# Patient Record
Sex: Female | Born: 1975 | Hispanic: No | Marital: Single | Smoking: Never smoker
Health system: Southern US, Community
[De-identification: ages and names within clinical notes are randomized; demographics above are authoritative.]

## PROBLEM LIST (undated history)

## (undated) DIAGNOSIS — O24419 Gestational diabetes mellitus in pregnancy, unspecified control: Secondary | ICD-10-CM

## (undated) HISTORY — DX: Gestational diabetes mellitus in pregnancy, unspecified control: O24.419

---

## 2000-12-23 ENCOUNTER — Ambulatory Visit (HOSPITAL_COMMUNITY): Admission: RE | Admit: 2000-12-23 | Discharge: 2000-12-23 | Payer: Self-pay | Admitting: Obstetrics

## 2001-05-22 ENCOUNTER — Inpatient Hospital Stay (HOSPITAL_COMMUNITY): Admission: AD | Admit: 2001-05-22 | Discharge: 2001-05-26 | Payer: Self-pay | Admitting: Obstetrics & Gynecology

## 2001-05-22 ENCOUNTER — Encounter (INDEPENDENT_AMBULATORY_CARE_PROVIDER_SITE_OTHER): Payer: Self-pay

## 2001-05-27 ENCOUNTER — Inpatient Hospital Stay (HOSPITAL_COMMUNITY): Admission: AD | Admit: 2001-05-27 | Discharge: 2001-05-30 | Payer: Self-pay | Admitting: *Deleted

## 2002-09-25 ENCOUNTER — Inpatient Hospital Stay (HOSPITAL_COMMUNITY): Admission: AD | Admit: 2002-09-25 | Discharge: 2002-09-25 | Payer: Self-pay | Admitting: Family Medicine

## 2003-06-03 ENCOUNTER — Encounter: Admission: RE | Admit: 2003-06-03 | Discharge: 2003-06-03 | Payer: Self-pay | Admitting: Obstetrics & Gynecology

## 2003-07-30 ENCOUNTER — Inpatient Hospital Stay (HOSPITAL_COMMUNITY): Admission: AD | Admit: 2003-07-30 | Discharge: 2003-07-30 | Payer: Self-pay | Admitting: *Deleted

## 2003-07-31 ENCOUNTER — Inpatient Hospital Stay (HOSPITAL_COMMUNITY): Admission: AD | Admit: 2003-07-31 | Discharge: 2003-08-02 | Payer: Self-pay | Admitting: Obstetrics & Gynecology

## 2003-08-17 ENCOUNTER — Encounter: Admission: RE | Admit: 2003-08-17 | Discharge: 2003-08-17 | Payer: Self-pay | Admitting: Obstetrics and Gynecology

## 2008-06-08 ENCOUNTER — Ambulatory Visit: Payer: Self-pay | Admitting: Family Medicine

## 2008-06-08 ENCOUNTER — Encounter: Payer: Self-pay | Admitting: Family Medicine

## 2008-06-08 LAB — CONVERTED CEMR LAB
Antibody Screen: NEGATIVE
Basophils Absolute: 0 10*3/uL (ref 0.0–0.1)
Basophils Relative: 0 % (ref 0–1)
Eosinophils Absolute: 0 10*3/uL (ref 0.0–0.7)
Eosinophils Relative: 1 % (ref 0–5)
HCT: 33.3 % — ABNORMAL LOW (ref 36.0–46.0)
Hemoglobin: 10.9 g/dL — ABNORMAL LOW (ref 12.0–15.0)
Hepatitis B Surface Ag: NEGATIVE
Lymphocytes Relative: 28 % (ref 12–46)
Lymphs Abs: 1.4 10*3/uL (ref 0.7–4.0)
MCHC: 32.7 g/dL (ref 30.0–36.0)
MCV: 89.8 fL (ref 78.0–100.0)
Monocytes Absolute: 0.2 10*3/uL (ref 0.1–1.0)
Monocytes Relative: 4 % (ref 3–12)
Neutro Abs: 3.4 10*3/uL (ref 1.7–7.7)
Neutrophils Relative %: 67 % (ref 43–77)
Platelets: 230 10*3/uL (ref 150–400)
RBC: 3.71 M/uL — ABNORMAL LOW (ref 3.87–5.11)
RDW: 13.3 % (ref 11.5–15.5)
Rh Type: POSITIVE
Rubella: 67.1 intl units/mL — ABNORMAL HIGH
Sickle Cell Screen: NEGATIVE
WBC: 5.1 10*3/uL (ref 4.0–10.5)

## 2008-06-15 ENCOUNTER — Encounter: Payer: Self-pay | Admitting: Family Medicine

## 2008-06-15 ENCOUNTER — Ambulatory Visit: Payer: Self-pay | Admitting: Family Medicine

## 2008-06-15 DIAGNOSIS — Z8742 Personal history of other diseases of the female genital tract: Secondary | ICD-10-CM | POA: Insufficient documentation

## 2008-06-15 LAB — CONVERTED CEMR LAB
Chlamydia, DNA Probe: NEGATIVE
GC Probe Amp, Genital: NEGATIVE

## 2008-06-24 ENCOUNTER — Encounter: Payer: Self-pay | Admitting: Family Medicine

## 2008-06-24 ENCOUNTER — Ambulatory Visit (HOSPITAL_COMMUNITY): Admission: RE | Admit: 2008-06-24 | Discharge: 2008-06-24 | Payer: Self-pay | Admitting: Family Medicine

## 2008-07-01 ENCOUNTER — Encounter: Payer: Self-pay | Admitting: Family Medicine

## 2008-07-06 ENCOUNTER — Ambulatory Visit: Payer: Self-pay | Admitting: Family Medicine

## 2008-07-07 ENCOUNTER — Encounter: Payer: Self-pay | Admitting: Family Medicine

## 2008-07-08 ENCOUNTER — Encounter: Payer: Self-pay | Admitting: Family Medicine

## 2008-07-08 ENCOUNTER — Ambulatory Visit (HOSPITAL_COMMUNITY): Admission: RE | Admit: 2008-07-08 | Discharge: 2008-07-08 | Payer: Self-pay | Admitting: Family Medicine

## 2008-07-29 ENCOUNTER — Ambulatory Visit: Payer: Self-pay | Admitting: Family Medicine

## 2008-07-29 LAB — CONVERTED CEMR LAB

## 2008-08-31 ENCOUNTER — Ambulatory Visit: Payer: Self-pay | Admitting: Family Medicine

## 2008-08-31 LAB — CONVERTED CEMR LAB
Bilirubin Urine: NEGATIVE
Blood in Urine, dipstick: NEGATIVE
Glucose, Urine, Semiquant: NEGATIVE
Ketones, urine, test strip: NEGATIVE
Nitrite: NEGATIVE
Protein, U semiquant: NEGATIVE
Specific Gravity, Urine: <1.005
Urobilinogen, UA: 0.2
WBC Urine, dipstick: NEGATIVE
pH: 6.5

## 2008-09-16 ENCOUNTER — Encounter: Payer: Self-pay | Admitting: Family Medicine

## 2008-09-16 ENCOUNTER — Ambulatory Visit: Payer: Self-pay | Admitting: Family Medicine

## 2008-09-16 LAB — CONVERTED CEMR LAB
Glucose, Urine, Semiquant: NEGATIVE
Hemoglobin: 11.6 g/dL — ABNORMAL LOW (ref 12.0–15.0)
MCHC: 33.4 g/dL (ref 30.0–36.0)
Platelets: 179 10*3/uL (ref 150–400)
RDW: 13.3 % (ref 11.5–15.5)
Varicella IgG: 2.47 — ABNORMAL HIGH

## 2008-09-20 ENCOUNTER — Ambulatory Visit: Payer: Self-pay | Admitting: Family Medicine

## 2008-09-20 ENCOUNTER — Encounter: Payer: Self-pay | Admitting: Family Medicine

## 2008-10-04 ENCOUNTER — Encounter: Payer: Self-pay | Admitting: Family Medicine

## 2008-10-04 ENCOUNTER — Ambulatory Visit: Payer: Self-pay | Admitting: Family Medicine

## 2008-10-04 LAB — CONVERTED CEMR LAB
Bilirubin Urine: NEGATIVE
Blood in Urine, dipstick: NEGATIVE
Glucose, Urine, Semiquant: NEGATIVE
Ketones, urine, test strip: NEGATIVE
Nitrite: NEGATIVE
Protein, U semiquant: NEGATIVE
Specific Gravity, Urine: 1.02
Urobilinogen, UA: 0.2
WBC Urine, dipstick: NEGATIVE
pH: 6.5

## 2008-10-21 ENCOUNTER — Ambulatory Visit: Payer: Self-pay | Admitting: Family Medicine

## 2008-11-05 ENCOUNTER — Ambulatory Visit: Payer: Self-pay | Admitting: Family Medicine

## 2008-11-05 ENCOUNTER — Encounter: Payer: Self-pay | Admitting: Family Medicine

## 2008-11-15 ENCOUNTER — Ambulatory Visit: Payer: Self-pay | Admitting: Family Medicine

## 2008-11-15 DIAGNOSIS — K029 Dental caries, unspecified: Secondary | ICD-10-CM | POA: Insufficient documentation

## 2008-11-26 ENCOUNTER — Ambulatory Visit: Payer: Self-pay | Admitting: Family Medicine

## 2008-12-04 ENCOUNTER — Ambulatory Visit: Payer: Self-pay | Admitting: Physician Assistant

## 2008-12-04 ENCOUNTER — Inpatient Hospital Stay (HOSPITAL_COMMUNITY): Admission: AD | Admit: 2008-12-04 | Discharge: 2008-12-06 | Payer: Self-pay | Admitting: Obstetrics & Gynecology

## 2008-12-04 ENCOUNTER — Ambulatory Visit: Payer: Self-pay | Admitting: Family Medicine

## 2009-01-20 ENCOUNTER — Ambulatory Visit: Payer: Self-pay | Admitting: Family Medicine

## 2009-01-20 LAB — CONVERTED CEMR LAB: Beta hcg, urine, semiquantitative: NEGATIVE

## 2010-08-13 ENCOUNTER — Encounter: Payer: Self-pay | Admitting: Family Medicine

## 2010-08-22 NOTE — Assessment & Plan Note (Signed)
Summary: postpartum,tcb   Vital Signs:  Patient profile:   35 year old female Height:      60.5 inches Weight:      140 pounds BMI:     26.99 Pulse rate:   65 / minute BP sitting:   126 / 81  (left arm)  Vitals Entered By: Arlyss Repress CMA, (January 20, 2009 3:26 PM) CC: postpartum check.  Is Patient Diabetic? No Pain Assessment Patient in pain? no        Primary Care Provider:  Helane Rima DO  CC:  postpartum check. .  History of Present Illness: Jackie Abbott is a 35 year old female presenting for a postpartum check. She delivered a viable female via NSVD last May.   She denies any physical complaints today but endorses some s/s of post-partum depression. These include: crying while breastfeeding, feeling alone even when she is with others, feeling overwhelmed, and sadness. She denies SI or any thoughts of hurting her children. Her husband is supportive and she has friends in the area but she does not feel comfortable talking to her friends about these feelings. She endorses a history of PPD with her other children, each time lasting about 3-4 months. She is not interested in medication and does not feel that these feelings are bad enough to warrant further evaluation or intervention at this time.  Contraception: She is interested in Implanon but has not gotten her [facility] [employer] card yet, so she would like to take OCPs until getting then.  Habits & Providers  Alcohol-Tobacco-Diet     Tobacco Status: never PMH-FH-SH reviewed for relevance  Review of Systems       see HPI, otherwise negative  Physical Exam  General:  alert, well-developed, well-nourished, and well-hydrated.   Head:  normocephalic and atraumatic.   Eyes:  vision grossly intact, pupils equal, pupils round, and pupils reactive to light.   Mouth:  pharynx pink and moist.   Neck:  supple.   Lungs:  CTAB Heart:  RRR no m/r/g Abdomen:  soft, non-tender, and normal bowel sounds.   Genitalia:  Pelvic Exam:     External: normal female genitalia without lesions or masses        Vagina: normal without lesions or masses        Cervix: normal without lesions or masses        Adnexa: normal bimanual exam without masses or fullness        Uterus: normal by palpation        Pap smear: not performed Psych:  Oriented X3, memory intact for recent and remote, normally interactive, good eye contact, not anxious appearing, not suicidal, and not homicidal.     Impression & Recommendations:  Problem # 1:  PREGNANCY, NORMAL (ICD-V22.2) Assessment New  Normal Post-Partum Check with endorsement of mild post-partum depression. Educated patient about this condition, reassured her that this is normal, and encouraged her to come back if it worsens. She endorsed understanding.   [facility]  Problem # 2:  CONTRACEPTIVE MANAGEMENT (ICD-V25.09) Assessment: New Rx: Tri Sprintec until the patient gets a Therapist, music orange card and then Implanon insertion.  Orders: U Preg-[facility] (60454) Postpartum visit- [facility] (09811)  Complete Medication List: 1)  Gnp Prenatal Vitamins Tabs (Prenatal vit-fe fumarate-fa) .... One by mouth daily 2)  Tri-sprintec 0.18/0.215/0.25 Mg-35 Mcg Tabs (Norgestim-eth estrad triphasic)  Patient Instructions: 1)  It was great to see you again! 2)  Let me know if you  have any concerns or questions. Prescriptions: TRI-SPRINTEC 0.18/0.215/0.25 MG-35 MCG TABS (NORGESTIM-ETH ESTRAD TRIPHASIC)   #0 x 0   Entered and Authorized by:   Helane Rima MD   Signed by:   Helane Rima MD on 01/20/2009   Method used:   Print then Give to Patient   RxID:   [id number]   Laboratory Results   Urine Tests  Date/Time Received: January 20, 2009 4:16 PM  Date/Time Reported: January 20, 2009 4:22 PM     Urine HCG: negative Comments: ...........test performed by...........Marland KitchenTerese Door, CMA      Appended Document: Changed to monophasic OCP    Clinical Lists  Changes  Medications: Changed medication from TRI-SPRINTEC 0.18/0.215/0.25 MG-35 MCG TABS (NORGESTIM-ETH ESTRAD TRIPHASIC) to SPRINTEC 28 0.25-35 MG-MCG TABS (NORGESTIMATE-ETH ESTRADIOL) one by mouth daily - Signed Rx of SPRINTEC 28 0.25-35 MG-MCG TABS (NORGESTIMATE-ETH ESTRADIOL) one by mouth daily;  #1 x 3;  Signed;  Entered by: Helane Rima MD;  Authorized by: Helane Rima MD;  Method used: Historical    Prescriptions: SPRINTEC 28 0.25-35 MG-MCG TABS (NORGESTIMATE-ETH ESTRADIOL) one by mouth daily  #1 x 3   Entered and Authorized by:   Helane Rima MD   Signed by:   Helane Rima MD on 01/25/2009   Method used:   Historical   RxID:   [id number]

## 2010-08-22 NOTE — Miscellaneous (Signed)
Summary: Orders Update  Clinical Lists Changes  Orders: Added new Test order of Prenatal U/S > 14 weeks - 24401 (Prenatal U/S) - Signed

## 2010-08-22 NOTE — Assessment & Plan Note (Signed)
Summary: ob/kh   Vital Signs:  Patient Profile:   35 Years Old Female Height:     142 inches Weight:      152 pounds Pulse rate:   69 / minute Pulse rhythm:   regular BP sitting:   105 / 66  (right arm)  Pt. in pain?   no  Vitals Entered By: Modesta Messing LPN (August 31, 2008 3:50 PM)                  PCP:  Helane Rima  Chief Complaint:  OB follow up visit.Marland Kitchen  History of Present Illness: Jackie Abbott is a 35 year old G3P2002 at 66.[redacted] weeks gestational age. EDC is 12/09/07 calculated by LMP (03/03/08) and confirmed by ultrasound (at 16.1 weeks with [facility] 12/03/07) on 06/24/08. She is participating in the [employer] program and presents with a Spanish-speaking interpretor.      Risk Factors:       Impression & Recommendations:  Problem # 1:  PREGNANCY, NORMAL (ICD-V22.2) Assessment: Unchanged Doing well. Follow up in 2 weeks OB Clinic. Discussed preterm labor precautions. Labs due at next visit include CBC, RPR, HIV, 1 hour glucola.  Type/Rh: O Positive Antibody Screen: Negative Hgb 10.9 (06/08/08) Platelets 230 (06/08/08) Rubella: Immune HBsAg: Negative Syphilis: NR HIV: NR GC: Negative (06/15/08) Chlamydia: Negative (06/15/08) Sickle Cell: Negative 1 Hour Glucola: 06/15/08 at 14.[redacted] Weeks GA = 129 MSAFP WNL + Received Seasonal and H1N1 vaccines Most recent ultrasound at 18.1 weeks: Normal Female  Name: Jackie Abbott Breast feeding Contraception: Condoms, thinking about BTL (does not want more babies)  Orders: Urinalysis-[facility] (00000) Other OB visit- [facility] (OBCK)   Problem # 2:  SUPRAPUBIC PAIN (ICD-789.09) UA negative. Pain likely due to enlarging uterus.  Orders: Urinalysis-[facility] (00000)   Problem # 3:  DIABETES MELLITUS, GESTATIONAL, HX OF (ICD-V13.29) Assessment: Unchanged 1 Hour Glucola: 06/15/08 at 14.[redacted] Weeks GA = 129 Will repeat at 28 week visit.   Patient Instructions: 1)  Please schedule a follow-up appointment in the OB clinic in 3  weeks. 2)  Please go to the [facility] if you have bleeding or a gush of fluid from your vagina or if you have contractions that are regular.     Flowsheet View for Follow-up Visit    Estimated weeks of       gestation:     25 6/7    Weight:     152    Blood pressure:   105 / 66    Urine Protein:     negative    Urine Glucose:   negative    Urine Nitrite:     negative    Headache:     No    Nausea/vomiting:   No    Edema:     0    Vaginal bleeding:   no    Vaginal discharge:   no    Fundal height:      26    FHR:       150s    Fetal activity:     yes    Labor symptoms:   no    Taking prenatal vits?   Y    Smoking:     n/a    Next visit:     2 wk    Resident:     EW    Preceptor:     Hensel    Comment:     Doing well. Mild round ligament pain. Some suprapubic pain,  intermittent, worse with walking, no dysuria.   Flowsheet View for Follow-up Visit    Estimated weeks of       gestation:     25 6/7    Weight:     152    Blood pressure:   105 / 66    Urine protein:       negative    Urine glucose:    negative    Urine nitrite:     negative    Hx headache?     No    Nausea/vomiting?   No    Edema?     0    Bleeding?     no    Leakage/discharge?   no    Fetal activity:       yes    Labor symptoms?   no    Fundal height:      26    FHR:       150s    Taking Vitamins?   Y    Smoking PPD:   n/a    Comment:     Doing well. Mild round ligament pain. Some suprapubic pain, intermittent, worse with walking, no dysuria.    Next visit:     2 wk    Resident:     EW    Preceptor:     Hensel   Laboratory Results   Urine Tests  Date/Time Received: August 31, 2008 4:09 PM  Date/Time Reported: August 31, 2008 4:11 PM   Routine Urinalysis   Color: yellow Appearance: Clear Glucose: negative   (Normal Range: Negative) Bilirubin: negative   (Normal Range: Negative) Ketone: negative   (Normal Range: Negative) Spec. Gravity: <1.005   (Normal Range:  1.003-1.035) Blood: negative   (Normal Range: Negative) pH: 6.5   (Normal Range: 5.0-8.0) Protein: negative   (Normal Range: Negative) Urobilinogen: 0.2   (Normal Range: 0-1) Nitrite: negative   (Normal Range: Negative) Leukocyte Esterace: negative   (Normal Range: Negative)    Comments: ...........test performed by...........Marland KitchenTerese Door, CMA

## 2010-08-22 NOTE — Assessment & Plan Note (Signed)
Summary: ob check   Vital Signs:  Patient Profile:   35 Years Old Female Height:     142 inches Weight:      151.1 pounds BP sitting:   117 / 74  Vitals Entered By: Dedra Skeens CMA, (July 29, 2008 4:15 PM)  Menstrual History: EDC by LMP: 12/08/2008 Best Working EDC: 12/08/2008                 PCP:  Helane Rima   History of Present Illness: Jackie Abbott is a 35 year old G3P2002 at 21.[redacted] weeks gestational age. EDC is 12/09/07 calculated by LMP (03/03/08) and confirmed by ultrasound (at 16.1 weeks with [facility] 12/03/07) on 06/24/08. She is participating in the [employer] program and presents with a Spanish-speaking interpretor.      Risk Factors:       Impression & Recommendations:  Problem # 1:  PREGNANCY, NORMAL (ICD-V22.2) No concerns today. Discussed small meals throughout the day, Tums, and elevating head-of-bed for improvement of reflux.  Type/Rh: O Positive Antibody Screen: Negative Hgb 10.9 (06/08/08) Platelets 230 (06/08/08) Rubella: Immune HBsAg: Negative Syphilis: NR HIV: NR GC: Negative (06/15/08) Chlamydia: Negative (06/15/08) Sickle Cell: Negative 1 Hour Glucola: 06/15/08 at 14.[redacted] Weeks GA = 129 MSAFP WNL + Received Seasonal and H1N1 vaccines Most recent ultrasound at 18.1 weeks: Normal Female  Name: Jackie Abbott   Problem # 2:  DIABETES MELLITUS, GESTATIONAL, HX OF (ICD-V13.29) 1 Hour Glucola: 06/15/08 at 14.[redacted] Weeks GA = 129 Will repeat at 28 week visit.  Other Orders: Other OB visit- [facility])   Patient Instructions: 1)  Please schedule a follow-up appointment in 1 month.   ]  OB Initial Intake Information    Race: Hispanic    Marital status: Single  Menstrual History    LMP (date): 03/03/2008    EDC by LMP: 12/08/2008    Best Working EDC: 12/08/2008    LMP - Character: normal    Menarche: 12 years    Menses interval: 28 days    On BCP's at conception: no    Date of positive (+) home preg. test: 04/11/2008   Flowsheet  View for Follow-up Visit    Estimated weeks of       gestation:     22 0/7    Weight:     151.1    Blood pressure:   117 / 74    Hx headache?     No    Nausea/vomiting?   No    Edema?     0    Bleeding?     no    Leakage/discharge?   no    Fetal activity:       yes    Labor symptoms?   no    Fundal height:      21    FHR:       150s    Taking Vitamins?   Y    Smoking PPD:   n/a    Comment:     No concerns today. Mild heartburn.    Next visit:     4 wk    Resident:     EW    Preceptor:     Sheffield Slider   OB Initial Intake Information    Race: Hispanic    Marital status: Single  Menstrual History    LMP (date): 03/03/2008    EDC by LMP: 12/08/2008    Best Working EDC: 12/08/2008    LMP - Character: normal  Menarche: 12 years    Menses interval: 28 days    On BCP's at conception: no    Date of positive (+) home preg. test: 04/11/2008   Prenatal Visit EDC Confirmation:    New working [facility]: 12/08/2008    EDC by LMP: 12/08/2008    Genetic History     > 3 spont. abortions:   mother: no    Hx of stillbirth:     mother: no  Infection Risk History    High Risk Hepatitis B: no    Immunized against Hepatitis B: no    Exposure to TB: no    Patient with history of Genital Herpes: no    Sexual partner with history of Genital Herpes: no    History of STD (GC, Chlamydia, Syphilis, HPV): no    Rash, Viral, or Febrile Illness since LMP: no    Exposure to Cat Litter: no  Environmental Exposures    Xray Exposure since LMP: no    Chemical or other exposure: no    Medication, drug, or alcohol use since LMP: no    Prenatal Education Provided: 1)  Elevate head of bed at least 6 inches (raise bed posts not just with pillows); small frequent meals and take TUMS as needed. 2)  Advice on healthy diet for pregnancy reviewed and information provided. 3)  Stressed importance of taking folic acid and Prenatal vitamins. 4)  Discussed signs and symptoms of premature labor and instructed  to report any such symtpoms immediately.

## 2010-08-22 NOTE — Assessment & Plan Note (Signed)
Summary: 1:45 ob/eo   Vital Signs:  Patient profile:   35 year old female Height:      60.5 inches Weight:      160.3 pounds BP sitting:   120 / 79  (right arm)  Vitals Entered By: Modesta Messing LPN (Nov 27, 863 1:43 PM) CC: OB follow up .  38 2/7 weeks   History of Present Illness: Jackie Abbott is a 35 year old G3P2002 at 30.[redacted] weeks gestational age. EDC is 12/08/08 calculated by LMP (03/03/08) and confirmed by ultrasound (at 16.1 weeks with [facility] 12/03/07) on 06/24/08. She is participating in the [employer] program and presents with a Spanish-speaking interpretor.   Habits & Providers     Cigarette Packs/Day: n/a   Impression & Recommendations:  Problem # 1:  PREGNANCY, NORMAL (ICD-V22.2) Assessment Unchanged  Doing well. Discussed labor precautions. Provided contact info. Dr. Jeanice Lim is back-up. Will give infor for [facility].  Type/Rh: O Positive Antibody Screen: Negative Hgb 10.9 (06/08/08), 11.6 (09/16/08) Platelets 230 (06/08/08), 179 (09/16/08) Rubella: Immune HBsAg: Negative Syphilis: NR x 2 HIV: NR x 2 GC: Negative (06/15/08) Chlamydia: Negative (06/15/08) Sickle Cell: Negative Varicella negative 1 Hour Glucola: 06/15/08 at 14.[redacted] Weeks GA = 129 1 Hour Glucola: 09/16/08 at 28.1 = 136 3 Hour (09/20/08): 73, 146, 164, 135 MSAFP WNL + Received Seasonal and H1N1 vaccines Most recent ultrasound at 18.1 weeks: Normal Female  Name: Jackie Abbott Contraception: Condoms GBS negative (11/05/08)  Orders: Other OB visit- [facility] (OBCK)  Problem # 2:  DIABETES MELLITUS, GESTATIONAL, HX OF (ICD-V13.29) Assessment: Unchanged 3 Hour (09/20/08): 73, 146, 164, 135. One elevated lab value (2 hour = 164). Discussed borderline diabetes with patient. Will monitor fundal height. Discussed diabetic diet with patient.  Problem # 3:  DENTAL CARIES (ICD-521.00) Discussed dangers of poor dental health during pregnancy and RED flags for seeking medical care. Patient has  dentist.  Complete Medication List: 1)  Gnp Prenatal Vitamins Tabs (Prenatal vit-fe fumarate-fa) .... One by mouth daily  Patient Instructions: 1)  Please schedule a follow-up appointment in 1 week.  2)  If you have contractions every 5 minutes for more than one hour or that do not go away with sitting down and drinking water, or if you have bleeding or a gush of fluid from your vagina, please go to the [facility]. I will give you my card to give to them when you get there. 3)  Please don't hesitate to call with questions.     OB Initial Intake Information    Race: Hispanic    Marital status: Single  Menstrual History    LMP (date): 03/03/2008    LMP - Character: normal    Menarche: 12 years    Menses interval: 28 days    On BCP's at conception: no    Date of positive (+) home preg. test: 04/11/2008   Flowsheet View for Follow-up Visit    Estimated weeks of       gestation:     38 2/7    Weight:     160.3    Blood pressure:   120 / 79    Headache:     No    Nausea/vomiting:   No    Edema:     0    Vaginal bleeding:   no    Vaginal discharge:   no    Fundal height:      39    FHR:  130s    Fetal activity:     yes    Labor symptoms:   few ctx    Cx Dilation:     2    Cx Effacement:   30%    Cx Station:     high    Taking prenatal vits?   Y    Smoking:     n/a    Next visit:     1 wk    Resident:     EW    Preceptor:     Chambliss    Comment:     Doing well. No concerns.   Flowsheet View for Follow-up Visit    Estimated weeks of       gestation:     38 2/7    Weight:     160.3    Blood pressure:   120 / 79    Headache:     No    Nausea/vomiting:   No    Edema:     0    Vaginal bleeding:   no    Vaginal discharge:   no    Fundal height:      39    FHR:       130s    Fetal activity:     yes    Labor symptoms:   few ctx    Cx Dilation:     2    Cx Effacement:   30%    Cx Station:     high    Taking prenatal vits?   Y    Smoking:     n/a     Next visit:     1 wk    Resident:     EW    Preceptor:     Chambliss    Comment:     Doing well. No concerns.    Flowsheet View for Follow-up Visit    Estimated weeks of       gestation:     38 2/7    Weight:     160.3    Blood pressure:   120 / 79    Hx headache?     No    Nausea/vomiting?   No    Edema?     0    Bleeding?     no    Leakage/discharge?   no    Fetal activity:       yes    Labor symptoms?   few ctx    Fundal height:      39    FHR:       130s    Cx dilation:     2    Cx effacement:   30%    Fetal station:     high    Taking Vitamins?   Y    Smoking PPD:   n/a    Comment:     Doing well. No concerns.    Next visit:     1 wk    Resident:     EW    Preceptor:     Deirdre Priest

## 2010-08-22 NOTE — Assessment & Plan Note (Signed)
Summary: OB/KH   Vital Signs:  Patient profile:   35 year old female Weight:      152.38 pounds Temp:     98.1 degrees F oral BP sitting:   104 / 64  (right arm)  Vitals Entered By: Dorna Leitz CC: OB VISIT   History of Present Illness: Jackie Abbott is a 35 year old G3P2002 at 44.[redacted] weeks gestational age. EDC is 12/08/08 calculated by LMP (03/03/08) and confirmed by ultrasound (at 16.1 weeks with [facility] 12/03/07) on 06/24/08. She is participating in the [employer]Mom program and presents with a Spanish-speaking interpretor.   Habits & Providers     Cigarette Packs/Day: n/a   Impression & Recommendations:  Problem # 1:  PREGNANCY, NORMAL (ICD-V22.2)  Doing well. Follow up in 2 weeks. GBS at next visit. Discussed preterm labor precautions. Provided note to work Re: sitting when patient feels the need to do so. Will monitor weight gain as patient down this week.  Provided contact info. Dr. Jeanice Lim is back-up.  Type/Rh: O Positive Antibody Screen: Negative Hgb 10.9 (06/08/08), 11.6 (09/16/08) Platelets 230 (06/08/08), 179 (09/16/08) Rubella: Immune HBsAg: Negative Syphilis: NR x 2 HIV: NR x 2 GC: Negative (06/15/08) Chlamydia: Negative (06/15/08) Sickle Cell: Negative Varicella negative 1 Hour Glucola: 06/15/08 at 14.[redacted] Weeks GA = 129 1 Hour Glucola 09/16/08 = 136 3 Hour (09/20/08): 73, 146, 164, 135 MSAFP WNL + Received Seasonal and H1N1 vaccines Most recent ultrasound at 18.1 weeks: Normal Female  Name: Jackie Abbott Breast feeding Contraception: Condoms, thinking about BTL (does not want more babies)  Orders: Other OB visit- [facility] (OBCK)  Problem # 2:  DIABETES MELLITUS, GESTATIONAL, HX OF (ICD-V13.29) Assessment: Unchanged 3 Hour (09/20/08): 73, 146, 164, 135. One elevated lab value (2 hour = 164). Discussed borderline diabetes with patient. Will not send to [facility] or begin medication at this time. Will monitor fundal height. Discussed diabetic diet with  patient.  Complete Medication List: 1)  Hydrocortisone 1 % Crea (Hydrocortisone) .... Apply to affected area on arms daily. do not apply to face dispense- 1 large tube  Patient Instructions: 1)  Please schedule a follow-up appointment in 2 weeks.  2)  If you have contractions every 5 minutes for more than one hour or that do not go away with sitting down and drinking water, or if you have bleeding or a gush of fluid from your vagina, please go to the [facility]. I will give you my card to give to them when you get there. 3)  Please don't hesitate to call with questions.   Flowsheet View for Follow-up Visit    Estimated weeks of       gestation:     33 1/7    Weight:     152.38    Blood pressure:   104 / 64    Hx headache?     No    Nausea/vomiting?   No    Edema?     0    Bleeding?     no    Leakage/discharge?   no    Fetal activity:       yes    Labor symptoms?   few ctx    Fundal height:      33    FHR:       150S    Taking Vitamins?   Y    Smoking PPD:   n/a    Comment:     Doing well. Few BH contractions.  Walking too much at work.    Next visit:     2 wk    Resident:     EW    Preceptor:     Mauricio Po

## 2010-08-22 NOTE — Assessment & Plan Note (Signed)
Summary: OB/KH   Vital Signs:  Patient profile:   35 year old female Weight:      158.7 pounds Temp:     98.4 degrees F oral Pulse rate:   65 / minute BP sitting:   123 / 84  (left arm)  Vitals Entered By: Alphia Kava (November 15, 2008 1:46 PM)  CC: OB Is Patient Diabetic? No   History of Present Illness: Jackie Abbott is a 35 year old G3P2002 at 28.[redacted] weeks gestational age. EDC is 12/08/08 calculated by LMP (03/03/08) and confirmed by ultrasound (at 16.1 weeks with [facility] 12/03/07) on 06/24/08. She is participating in the [employer] program and presents with a Spanish-speaking interpretor.   Habits & Providers     Cigarette Packs/Day: n/a   Impression & Recommendations:  Problem # 1:  PREGNANCY, NORMAL (ICD-V22.2) Assessment Unchanged  Doing well. Discussed labor precautions. Patient did lose weight this week, says that she has been working hard (includes lots of walking), but baby measuring well. Provided contact info. Dr. Jeanice Lim is back-up.  Type/Rh: O Positive Antibody Screen: Negative Hgb 10.9 (06/08/08), 11.6 (09/16/08) Platelets 230 (06/08/08), 179 (09/16/08) Rubella: Immune HBsAg: Negative Syphilis: NR x 2 HIV: NR x 2 GC: Negative (06/15/08) Chlamydia: Negative (06/15/08) Sickle Cell: Negative Varicella negative 1 Hour Glucola: 06/15/08 at 14.[redacted] Weeks GA = 129 1 Hour Glucola 09/16/08 = 136 3 Hour (09/20/08): 73, 146, 164, 135 MSAFP WNL + Received Seasonal and H1N1 vaccines Most recent ultrasound at 18.1 weeks: Normal Female  Name: Jackie Abbott feeding Contraception: Condoms, thinking about BTL (does not want more babies) GBS negative (11/05/08)  Orders: Other OB visit- [facility] (OBCK)  Problem # 2:  DIABETES MELLITUS, GESTATIONAL, HX OF (ICD-V13.29) Assessment: Unchanged  3 Hour (09/20/08): 73, 146, 164, 135. One elevated lab value (2 hour = 164). Discussed borderline diabetes with patient. Will not send to [facility] or begin medication at this time. Will monitor  fundal height. Discussed diabetic diet with patient.  Orders: Other OB visit- [facility] (OBCK)  Problem # 3:  DENTAL CARIES (ICD-521.00) Assessment: New  Discussed dangers of poor dental health during pregnancy and RED flags for seeking medical care. Patient has dentist, needs okay for local anesthesia for dental procedure. Precepted with Dr. Mauricio Po, gave okay.  Orders: Other OB visit- [facility] (OBCK)  Complete Medication List: 1)  Gnp Prenatal Vitamins Tabs (Prenatal vit-fe fumarate-fa) .... One by mouth daily  Patient Instructions: 1)  Please schedule a follow-up appointment in 1 week.  2)  If you have contractions every 5 minutes for more than one hour or that do not go away with sitting down and drinking water, or if you have bleeding or a gush of fluid from your vagina, please go to the [facility]. I will give you my card to give to them when you get there. 3)  Please don't hesitate to call with questions. Prescriptions: GNP PRENATAL VITAMINS  TABS (PRENATAL VIT-FE FUMARATE-FA) one by mouth daily  #30 x 3   Entered and Authorized by:   Helane Rima MD   Signed by:   Helane Rima MD on 11/15/2008   Method used:   Historical   RxID:   [id number]        OB Initial Intake Information    Race: Hispanic    Marital status: Single  Menstrual History    LMP (date): 03/03/2008    LMP - Character: normal    Menarche: 12 years    Menses interval: 28 days  On BCP's at conception: no    Date of positive (+) home preg. test: 04/11/2008   Flowsheet View for Follow-up Visit    Estimated weeks of       gestation:     36 5/7    Weight:     158.7    Blood pressure:   123 / 84    Headache:     No    Nausea/vomiting:   No    Edema:     0    Vaginal bleeding:   no    Vaginal discharge:   no    Fundal height:      38    FHR:       150s    Fetal activity:     yes    Labor symptoms:   few ctx    Fetal position:     vertex    Cx Dilation:     2    Cx Effacement:   30%     Cx Station:     high    Taking prenatal vits?   Y    Smoking:     n/a    Next visit:     1 wk    Resident:     EW    Preceptor:     Mauricio Po    Comment:     Doing well. Ready to stop working.    Flowsheet View for Follow-up Visit    Estimated weeks of       gestation:     36 5/7    Weight:     158.7    Blood pressure:   123 / 84    Hx headache?     No    Nausea/vomiting?   No    Edema?     0    Bleeding?     no    Leakage/discharge?   no    Fetal activity:       yes    Labor symptoms?   few ctx    Fundal height:      38    FHR:       150s    Fetal position:      vertex    Cx dilation:     2    Cx effacement:   30%    Fetal station:     high    Taking Vitamins?   Y    Smoking PPD:   n/a    Comment:     Doing well. Ready to stop working.     Next visit:     1 wk    Resident:     EW    Preceptor:     Mauricio Po   Appended Document: OB/KH FAXED RECORDS TO [facility]

## 2010-08-22 NOTE — Assessment & Plan Note (Signed)
Summary: OB F/U [facility]   Vital Signs:  Patient profile:   35 year old female Height:      142 inches Weight:      154 pounds BMI:     5.39 BP sitting:   120 / 76  History of Present Illness: Jackie Abbott is a 35 year old G3P2002 at 79.[redacted] weeks gestational age. EDC is 12/09/07 calculated by LMP (03/03/08) and confirmed by ultrasound (at 16.1 weeks with [facility] 12/03/07) on 06/24/08. She is participating in the [employer] program and presents with a Spanish-speaking interpretor. No complaints today.  Habits & Providers     Alcohol drinks/day: 0     Alcohol Counseling: not indicated; patient does not drink     Tobacco Status: never     Tobacco Counseling: not indicated; no tobacco use     Cigarette Packs/Day: n/a     Diet Comments: Borderline Diabetes     Diet Counseling: to improve diet; diet is suboptimal     STD Risk: never     STD Risk Counseling: not indicated-no STD risk noted     Drug Use: never     Seat Belt Use: always  Social History:    Smoking Status:  never    STD Risk:  never    Drug Use:  never    Risk analyst Use:  always   Impression & Recommendations:  Problem # 1:  PREGNANCY, NORMAL (ICD-V22.2) Assessment Unchanged  Doing well. Follow up in 2 weeks. Discussed preterm labor precautions. Provided contact info. Dr. Jeanice Lim is back-up.  Type/Rh: O Positive Antibody Screen: Negative Hgb 10.9 (06/08/08), 11.6 (09/16/08) Platelets 230 (06/08/08), 179 (09/16/08) Rubella: Immune HBsAg: Negative Syphilis: NR x 2 HIV: NR x 2 GC: Negative (06/15/08) Chlamydia: Negative (06/15/08) Sickle Cell: Negative Varicella negative 1 Hour Glucola: 06/15/08 at 14.[redacted] Weeks GA = 129 1 Hour Glucola 09/16/08 = 136 3 Hour (09/20/08): 73, 146, 164, 135 MSAFP WNL + Received Seasonal and H1N1 vaccines Most recent ultrasound at 18.1 weeks: Normal Female  Name: Jackie Abbott Contraception: Condoms, thinking about BTL (does not want more babies)  Orders: Other OB visit- [facility]  (OBCK) Other OB visit- [facility] (OBCK)  Problem # 2:  DIABETES MELLITUS, GESTATIONAL, HX OF (ICD-V13.29) Assessment: Unchanged  3 Hour (09/20/08): 73, 146, 164, 135. One elevated lab value (2 hour = 164). Discussed borderline diabetes with patient. Will not send to [facility] or begin medication at this time. Will monitor fundal height. Discussed diabetic diet with patient. Handouts given.  Orders: Other OB visit- [facility] (OBCK) Other OB visit- [facility] (OBCK)  Problem # 3:  ASYMPTOMATIC BACTERIURIA ANTEPARTUM (UJW-119.14) Assessment: Improved Treated with Keflex at last visit. TOC today.  Orders: Urine Culture-[facility] ([id number]) Urinalysis-[facility] (00000) Other OB visit- [facility] (OBCK)  Complete Medication List: 1)  Keflex 250 Mg Caps (Cephalexin) .Marland Kitchen.. 1 by mouth qid x 7 days 2)  Hydrocortisone 1 % Crea (Hydrocortisone) .... Apply to affected area on arms daily. do not apply to face dispense- 1 large tube  Patient Instructions: 1)  Please schedule follow-up appt in 2 weeks with Dr. Earlene Plater.  2)  Please go to the [facility] if you have contractions, vaginal bleeding or fluid. If you need to go to the [facility], please have them page me at [phone]. Dr. Jeanice Lim is back-up. 3)  The medication list was reviewed and reconciled.  All changed/ newly prescribed medications were explained.  A complete medication list was provided to the patient/ caregiver.  OB Initial Intake Information    Race: Hispanic    Marital status: Single  Menstrual History    LMP (date): 03/03/2008    LMP - Character: normal    Menarche: 12 years    Menses interval: 28 days    On BCP's at conception: no    Date of positive (+) home preg. test: 04/11/2008   Flowsheet View for Follow-up Visit    Estimated weeks of       gestation:     30 5/7    Weight:     154    Blood pressure:   120 / 76    Urine Protein:     negative    Urine Glucose:   negative    Urine Nitrite:     negative    Headache:     No     Nausea/vomiting:   No    Edema:     0    Vaginal bleeding:   no    Vaginal discharge:   no    Fundal height:      32    FHR:       150s    Fetal activity:     yes    Labor symptoms:   no    Taking prenatal vits?   Y    Smoking:     n/a    Next visit:     2 wk    Resident:     EW    Preceptor:     [place]    Comment:     Doing well. No complaints.     Flowsheet View for Follow-up Visit    Estimated weeks of       gestation:     30 5/7    Weight:     154    Blood pressure:   120 / 76    Urine protein:       negative    Urine glucose:    negative    Urine nitrite:     negative    Hx headache?     No    Nausea/vomiting?   No    Edema?     0    Bleeding?     no    Leakage/discharge?   no    Fetal activity:       yes    Labor symptoms?   no    Fundal height:      32    FHR:       150s    Taking Vitamins?   Y    Smoking PPD:   n/a    Comment:     Doing well. No complaints.     Next visit:     2 wk    Resident:     EW    Preceptor:     [place]  Laboratory Results   Urine Tests  Date/Time Received: October 04, 2008 9:52 AM  Date/Time Reported: October 04, 2008 9:57 AM   Routine Urinalysis   Color: yellow Appearance: Clear Glucose: negative   (Normal Range: Negative) Bilirubin: negative   (Normal Range: Negative) Ketone: negative   (Normal Range: Negative) Spec. Gravity: 1.020   (Normal Range: 1.003-1.035) Blood: negative   (Normal Range: Negative) pH: 6.5   (Normal Range: 5.0-8.0) Protein: negative   (Normal Range: Negative) Urobilinogen: 0.2   (Normal Range: 0-1) Nitrite: negative   (Normal Range: Negative) Leukocyte Esterace: negative   (Normal  Range: Negative)    Comments: urine cultured ...............test performed by......Marland KitchenBonnie A. [place], [place] (ASCP)     OB Initial Intake Information    Race: Hispanic    Marital status: Single  Menstrual History    LMP (date): 03/03/2008    LMP - Character: normal    Menarche: 12 years    Menses interval: 28  days    On BCP's at conception: no    Date of positive (+) home preg. test: 04/11/2008  Appended Document: OB F/U [facility] Jackie Abbott wanting information for her dentist. Has cavity and wants to know what type of anesthesia would be safe. Likely local for cavity, which should be safe. Gave [facility] phone number for patient to give to dentist.

## 2010-08-22 NOTE — Miscellaneous (Signed)
Summary: Orders Update  Clinical Lists Changes  Orders: Added new Test order of AFP/Quad Scr-[facility] ([id number]) - Signed  Appended Document: Orders Update NOT DONE.

## 2010-08-22 NOTE — Assessment & Plan Note (Signed)
Summary: NOB/AAM/DSL   Vital Signs:  Patient Profile:   35 Years Old Female LMP:     03/03/2008 Height:     142 inches Weight:      142.5 pounds Temp:     98.4 degrees F oral Pulse rate:   70 / minute BP sitting:   114 / 73  (left arm)  Vitals Entered By: Alphia Kava (June 15, 2008 1:39 PM)  Menstrual History: LMP (date): 03/03/2008 EDC by LMP: 12/08/2008 LMP - Character: normal LMP - Reliable: definite Menarche: 12 Menses interval: 28 days On BCP's at conception: no Date of positive (+) home preg. test: 04/11/2008                 Chief Complaint:  NOB.    Past Surgical History:    negative   Social History:    Denies tobacco, ETOH, drugs. Lives with husband Lars Mage) and 2 daughters (Lucy,7 and Clayton, 4). Works at [employer].    Risk Factors:  HIV high-risk behavior:  Negative       Patient Instructions: 1)  Follow up in 1 month or sooner if needed. 2)  Continue to take your prenatal vitamins.   ]     Vital Signs:  Patient Profile:   35 Years Old Female LMP:     03/03/2008 Height:     142 inches Weight:      142.5 pounds Temp:     98.4 degrees F oral Pulse rate:   70 / minute BP sitting:   114 / 73 Height (in): 142   Pre-pregnant weight (lbs): 145    Head exam: normocephalic, no lesions or deformities Nasopharynx: No nasal discharge, pharyngeal exudate, tonsillar enlargement, glossitis, pharyngeal erythema Thyroid: no nodules, masses, tenderness, or enlargement Lungs: clear to auscultation Skin: no rashes Vagina: normal appearance, no discharge, lesions Fundal Ht: U-2 FHT: 150s     Past Medical History - Patient/Family Anemia: negative Autoimmune Disorder: negative Bleeding Disorder: negative Blood Transfusions: negative Breast Disease: negative Diabetes: positive, Gestational Diabetes with Second Pregnancy Heart Disease: negative Hypertension: negative, PIH/Preeclampsia with First Pregnancy Hepatitis/Liver Disease:  negative Kidney Disease/UTI: negative Neurologic/Epilepsy/Migraines: negative Phlebitis/Varicosities: negative Psychiatric: negative Pulmonary Disease/Asthma: negative Thyroid Disease: negative Hospitalizations: negative Surgery (Non-gyn): negative Abnormal PAP: negative DES Exposure: negative Infertility: negative Uterine Anomaly: negative Uterine Surgery (not C/S): negative Other Gynecologic Problems: negative    Social Hx: Denies tobacco, ETOH, drugs. Lives with husband Lars Mage) and 2 daughters (Lucy,7 and [place], 4). Works at [employer].   Infection History Hx of Chlamydia: No Hx of Gonorrhea: No Hx of Syphilis: No HIV: Negative Hepatitis B: Negative Personal hx. of genital herpes: no Partner hx. of genital herpes: no Varicella/Chicken Pox Status: Unknown  Genetic History Thalassemia  Mom: no  Dad: no Neural Tube Defect  Mom: no  Dad: no Down's Syndrome Mom: no  Dad: no Tay-Sachs   Mom: no  Dad: no Sickle Cell Disease/Trait  Mom: no  Dad: no Hemophilia   Mom: no  Dad: no Muscular Dystrophy  Mom: no  Dad: no Cystic Fibrosis   Mom: no  Dad: no Huntington Chorea   Mom: no  Dad: no Mental Retardation   Mom: no  Dad: no Fragile X  Mom: no  Dad: no Other Genetic/Chromosomal Disorder  Mom: no  Dad: no Child w/other birth defect  Mom: no  Dad: no  Jackie Abbott is a 33 years old old G: 3  P: 2 0 0 2 @  weeks gestation. She is  here for a new OB visit.        Menstrual History LMP: 03/03/2008 Reliability: definite   Flow: normal    Menstrual regularity: regular  , every 28 days Menarche (age onset): 12 On BCP's at conception: no Date of positive home pregnancy test: 04/11/2008    Pregnancy Dating LMP Date: 03/03/2008     EDC by LMP: 12/08/2008   EGA by LMP: [redacted]W[redacted]D              {fmt("Follow up Obstetrical Visit HPI", "B,2") + "\r\n" + fmt(PATIENT.FIRSTNAME + " is a " + PATIENT.FORMATTEDAGE,"B") + " " + cfmt(OBSANY("Gravid"),"","G: ","B"," ") + IF  (OBSANY("GRAVID")=="" OR OBSANY("GRAVID")=="0" OR OBSANY("GAVID")=="1") and (OBSANY("PARA")<"1") THEN "" ELSE IF OBSANY("PARA")=="" AND OBSANY("TERM")=="" THEN fmt("P: ","B")+"0 " ELSE IF OBSANY("TERM")<>"" AND OBSANY("PARA")=="" THEN fmt("P: ","B")+ OBSANY("Term") + " " ELSE IF OBSANY("TERM")=="" AND OBSANY("PARA")<>"" THEN fmt("P: ","B")+ OBSANY("PARA") + " " ELSE fmt("P: ","B")+OBSANY("TERM") + " " ENDIF ENDIF ENDIF ENDIF + IF obsany("Gravid")=="1" and obsany("PARA")=="" THEN "" ELSE if OBSANY("Premature")=="" AND OBSANY("TERM")<>"" THEN "0 " ELSE OBSANY("Premature") + " " ENDIF ENDIF + IF obsany("Gravid")=="1" and OBSANY("PARA")=="" THEN "" ELSE if OBSANY("Abortions")=="" AND OBSANY("TERM")<>"" THEN "0 " ELSE OBSANY("Abortions") + " " ENDIF ENDIF + IF obsany("Gravid")=="1" and OBSANY("PARA")=="" THEN "" ELSE if OBSANY("Living")=="" AND OBSANY("TERM")<>"" THEN "0 " ELSE OBSANY("Living") + " " ENDIF ENDIF + IF OBSANY("Spon abort")=="" or OBSANY("SPON ABORT")=="0" then "" else fmt(" SAB: " ,"B")+OBSANY("SPON ABORT") + " " endif + IF OBSANY("Elect abort")=="" or OBSANY("Elect abort")=="0" then "" else fmt(" EAB: " ,"B")+OBSANY("Elect abort") + " " endif + IF OBSANY("Ectopic preg")=="" or OBSANY("Ectopic preg")=="0" then "" else fmt(" Ect: " ,"B") + OBSANY("Ectopic preg") + " " endif + IF OBSANY("Est deliv da")=="" AND OBSANY("EDC")=="" AND OBSANY("Gest age est")=="" THEN FMT(" who presents for follow up visit. ","B") ELSE "" ENDIF + if currentpregobs("LAST MP",currentpreg)== "TRUE" AND OBSANY("LAST MP") <> "" then ", " + fmt("LMP ", "B") + obsany("LAST MP") + " " else "" endif + IF currentpregobs("EDC",currentpreg) == "FALSE" AND OBSANY("EDC")=="" THEN "" ELSE IF currentpregobs("EDC",currentpreg)=="FALSE" AND OBSANY("EDC")<>"" THEN "" ELSE IF currentpregobs("Est deliv da",currentpreg)=="TRUE" AND OBSANY("EDC")=="" THEN "" ELSE ", " + FMT("EDC ","B") + OBSANY("EDC") ENDIF ENDIF ENDIF + IF  currentpregobs("EDC",currentpreg)=="TRUE" AND OBSANY("EDC")<>"" THEN "" ELSE IF OBSANY("Est deliv da")=="" AND currentpregobs("Est deliv da",currentpreg)=="FALSE" THEN "" ELSE IF OBSANY("Est deliv da")<>"" AND currentpregobs("Est deliv da",currentpreg)=="FALSE" THEN "" ELSE IF currentpregobs("Est deliv da",currentpreg)=="TRUE" AND OBSANY("Est deliv da")=="" THEN "" ELSE ", " + FMT("EDC ", "B") + OBSANY("Est deliv da") ENDIF ENDIF ENDIF ENDIF + IF OBSANY("prev preg ga")=="" AND currentpregobs("prev preg ga",currentpreg)=="FALSE" THEN "" ELSE IF OBSANY("prev preg ga")<>"" AND currentpregobs("prev preg ga",currentpreg)=="FALSE" THEN "" ELSE IF OBSANY("prev preg ga")=="" AND currentpregobs("prev preg ga",currentpreg)=="TRUE" THEN "" ELSE " , " + FMT("EGA ","B") + [id number] + " wks. " ENDIF ENDIF ENDIF + IF currentpregobs("EDD done by",currentpreg)=="FALSE" AND OBSANY("EDD done by")=="" THEN "" ELSE IF currentpregobs("EDD done by",currentpreg)=="FALSE" AND OBSANY("EDD done by")<>"" THEN "" ELSE IF currentpregobs("EDD done by",currentpreg)=="TRUE" AND OBSANY("EDD done by")=="" THEN "" ELSE FMT("by " + OBSANY("EDD done by") + ". ","") ENDIF ENDIF ENDIF + IF DOCUMENT.[id number]=="" THEN "" ELSE "\r\n" + fmt("CC: ","B") ENDIF + IF DOCUMENT.[id number]=="" THEN "" ELSE IF DOCUMENT.[id number]="none - patient is feeling well" THEN "Follow up OB visit - feeling well. " ELSE "Follow up OB visit and complains of " + DOCUMENT.[id number] + ". " ENDIF ENDIF + IF DOCUMENT.[id number]=="" AND DOCUMENT.[id number]=="" THEN "" ELSE IF DOCUMENT.[id number]=="" THEN "\r\n" + "She has experienced " + DOCUMENT.[id number] + ". " ELSE IF DOCUMENT.[id number]=="" THEN "\  r\n" + "She has experienced " + DOCUMENT.[id number] + ". " ELSE "\r\n" + "She has experienced " +  DOCUMENT.[id number] + ", " + DOCUMENT.[id number] + ". " ENDIF ENDIF ENDIF + IF DOCUMENT.[id number]=="" AND DOCUMENT.[id number]=="" THEN "" ELSE IF DOCUMENT.[id number]=="" THEN "There is no history of " + DOCUMENT.[id number] + ". " ELSE IF DOCUMENT.[id number]=="" THEN "There is no history of " + DOCUMENT.[id number] + ". " ELSE "There is no history of " + DOCUMENT.[id number] + ", " + DOCUMENT.[id number] + ". " ENDIF ENDIF ENDIF + IF OBSNOW("LMP reliabl")=="" AND currentpregobs("Last MP",currentpreg)=="TRUE" THEN "" ELSE IF OBSNOW("LMP reliabl")<>"" AND currentpregobs("Last MP",currentpreg)=="TRUE" THEN " Her last MP was on " + OBSANY("Last MP") + " and she is " + OBSNOW("LMP reliabl") + " of the date. " ELSE IF OBSNOW("LMP reliabl")<>"" AND currentpregobs("Last MP",currentpreg)=="FALSE" THEN " She is " + OBSNOW("LMP reliabl") + " of the date of her LMP. " ELSE "" ENDIF ENDIF ENDIF + IF OBSNOW("preg tst typ")=="" AND OBSNOW("preg tst dte")=="" THEN "" ELSE IF OBSNOW("preg tst typ")=="" THEN " Her pregnancy test was positive on " + OBSNOW("preg tst dte") + ". " ELSE IF OBSNOW("preg tst dte")=="" THEN " Pregnancy was confirmed by a " + OBSNOW("preg tst typ") + " test. " ELSE " She had a positive " + OBSNOW("preg tst typ") + " test on " + OBSNOW("preg tst dte") + ". " ENDIF ENDIF ENDIF + IF DOCUMENT.[id number]=="" THEN "" ELSE IF DOCUMENT.[id number]="yes" THEN "Patient is compliant with prenatal vitamins. " ELSE "Patient is NOT compliant with prenatal vitamins. " ENDIF ENDIF + IF DOCUMENT.[id number]=="" THEN "" ELSE IF DOCUMENT.[id number]="yes" THEN "She currently is taking iron supplements. " ELSE "She is NOT taking iron supplement. " ENDIF ENDIF + IF DOCUMENT.[id number]=="" THEN "" ELSE  DOCUMENT.[id number] + " " ENDIF + if [id number]<>"" then "\r\n" + "Fetal movement is " + [id number] + ". " else "" endif <-FUNCTION DEFINITION IS NOT EXECUTABLE   First:        Delivery date: 04/30/2001    Flowsheet View for Follow-up Visit    Weight:     142.5    Blood pressure:   114 / 73    Fundal height:      U-2    FHR:       150s    Cx dilation:     0

## 2010-08-22 NOTE — Assessment & Plan Note (Signed)
Summary: OB/KH   Vital Signs:  Patient profile:   35 year old female Height:      60.5 inches Weight:      160 pounds BMI:     30.84 Temp:     97.8 degrees F oral Pulse rate:   80 / minute Pulse rhythm:   regular BP sitting:   109 / 66  (right arm)  Vitals Entered By: Jackie Messing LPN (November 05, 2008 1:57 PM) CC: OB Follow up visit. Pain Assessment Patient in pain? no        History of Present Illness: Jackie Abbott is a 35 year old G3P2002 at 72.[redacted] weeks gestational age. EDC is 12/08/08 calculated by LMP (03/03/08) and confirmed by ultrasound (at 37.1 weeks with [facility] 12/03/07) on 06/24/08. She is participating in the [employer] program and presents with a Spanish-speaking interpretor.   Habits & Providers     Cigarette Packs/Day: n/a   Impression & Recommendations:  Problem # 1:  PREGNANCY, NORMAL (ICD-V22.2) Assessment Unchanged Doing well. GBS test today. Discussed labor precautions. Provided contact info. Dr. Jeanice Lim is back-up.  Type/Rh: O Positive Antibody Screen: Negative Hgb 10.9 (06/08/08), 11.6 (09/16/08) Platelets 230 (06/08/08), 179 (09/16/08) Rubella: Immune HBsAg: Negative Syphilis: NR x 2 HIV: NR x 2 GC: Negative (06/15/08) Chlamydia: Negative (06/15/08) Sickle Cell: Negative Varicella negative 1 Hour Glucola: 06/15/08 at 37.[redacted] Weeks GA = 129 1 Hour Glucola 09/16/08 = 136 3 Hour (09/20/08): 73, 146, 164, 135 MSAFP WNL + Received Seasonal and H1N1 vaccines Most recent ultrasound at 18.1 weeks: Normal Female  Name: Jackie Abbott Contraception: Condoms, thinking about BTL (does not want more babies)  Problem # 2:  DIABETES MELLITUS, GESTATIONAL, HX OF (ICD-V13.29) Assessment: Unchanged 3 Hour (09/20/08): 73, 146, 164, 135. One elevated lab value (2 hour = 164). Discussed borderline diabetes with patient. Will not send to [facility] or begin medication at this time. Will monitor fundal height. Discussed diabetic diet with patient.  Complete  Medication List: 1)  Hydrocortisone 1 % Crea (Hydrocortisone) .... Apply to affected area on arms daily. do not apply to face dispense- 1 large tube  Other Orders: Grp B Probe-[facility] ([id number]) Other OB visit- [facility])  Patient Instructions: 1)  Please schedule a follow-up appointment in 1 week.  2)  If you have contractions every 5 minutes for more than one hour or that do not go away with sitting down and drinking water, or if you have bleeding or a gush of fluid from your vagina, please go to the [facility]. I will give you my card to give to them when you get there. 3)  Please don't hesitate to call with questions.       Flowsheet View for Follow-up Visit    Estimated weeks of       gestation:     35 2/7    Weight:     160    Blood pressure:   109 / 66    Headache:     No    Nausea/vomiting:   No    Edema:     0    Vaginal bleeding:   no    Vaginal discharge:   no    Fundal height:      37    FHR:       150s    Fetal activity:     yes    Labor symptoms:   few ctx    Fetal position:  vertex    Cx Dilation:     0    Cx Effacement:   0    Cx Station:     high    Taking prenatal vits?   Y    Smoking:     n/a    Next visit:     1 wk    Resident:     EW    Preceptor:     Chambliss    Comment:     Doing well. Few BH contractions. Trying to adhere to diabetic diet.   Flowsheet View for Follow-up Visit    Estimated weeks of       gestation:     35 2/7    Weight:     160    Blood pressure:   109 / 66    Hx headache?     No    Nausea/vomiting?   No    Edema?     0    Bleeding?     no    Leakage/discharge?   no    Fetal activity:       yes    Labor symptoms?   few ctx    Fundal height:      37    FHR:       150s    Fetal position:      vertex    Cx dilation:     0    Cx effacement:   0    Fetal station:     high    Taking Vitamins?   Y    Smoking PPD:   n/a    Comment:     Doing well. Few BH contractions. Trying to adhere to diabetic diet.    Next  visit:     1 wk    Resident:     EW    Preceptor:     Chambliss    Flowsheet View for Follow-up Visit    Estimated weeks of       gestation:     35 2/7    Weight:     160    Blood pressure:   109 / 66    Headache:     No    Nausea/vomiting:   No    Edema:     0    Vaginal bleeding:   no    Vaginal discharge:   no    Fundal height:      37    FHR:       150s    Fetal activity:     yes    Labor symptoms:   few ctx    Fetal position:     vertex    Cx Dilation:     0    Cx Effacement:   0    Cx Station:     high    Taking prenatal vits?   Y    Smoking:     n/a    Next visit:     1 wk    Resident:     EW    Preceptor:     Chambliss    Comment:     Doing well. Few BH contractions. Trying to adhere to diabetic diet.

## 2010-08-29 ENCOUNTER — Encounter: Payer: Self-pay | Admitting: *Deleted

## 2010-10-31 LAB — CBC
HCT: 35 % — ABNORMAL LOW (ref 36.0–46.0)
Hemoglobin: 12.5 g/dL (ref 12.0–15.0)
MCHC: 35.7 g/dL (ref 30.0–36.0)
RDW: 13.1 % (ref 11.5–15.5)

## 2010-12-08 NOTE — Discharge Summary (Signed)
[facility]  Patient:    ARLANA, CANIZALES Visit Number: [id number] MRN: [id number]          Service Type: OBS Location: 910A 9104 01 Attending Physician:  Antionette Char Dictated by:   Michell Heinrich, M.D. Admit Date:  05/22/2001 Discharge Date: 05/26/2001                             Discharge Summary  ADMISSION DIAGNOSES: 1. A 37-5/7 weeks intrauterine pregnancy. 2. Severe preeclampsia.  DISCHARGE DIAGNOSES: 1. A 37-5/7 weeks intrauterine pregnancy. 2. Severe preeclampsia. 3. Blood loss anemia. 4. Successful delivery of a viable female infant, Apgars 3 at one minute and 8    at five minutes.  DISCHARGE MEDICATIONS: 1. Iron sulfate 325 mg b.i.d. x 6 weeks. 2. Colace 100 mg b.i.d. 3. Prenatal vitamins one q.d. x 6 weeks.  CONSULTATIONS:  None.  PROCEDURES:  None.  HISTORY OF PRESENT ILLNESS:  For complete H&P, please see resident H&P in chart.  Briefly, this is a 35 year old, G1, P0, who presented at 37-5/[redacted] weeks gestation to her clinic appointment, and was found to be hypertensive.  She did not have any headache or visual changes or hyperreflexia.  Her cervix examination was soft, 1 cm, 50%, and ballotable.  Fetal heart rate was reassuring.  A clean catch urine showed greater than 300 mg of protein.  Liver function tests were normal, platelet count was 104,000.  She was diagnosed with severe preeclampsia, and was admitted for induction of labor.  HOSPITAL COURSE:  #1 - INDUCTION OF LABOR:  The patient was admitted to the labor and delivery unit and Cytotec induction was begun.  She had adequate ripening and progressed into labor without difficulty.  There was some persistent scanty bright red blood per vagina which was suspicious for partial abruption.  She had no significant fetal heart rate decelerations.  She had a prolonged second stage of labor, and there was maternal fatigue.  The fetal heart rate at that time was  reassuring, but not reactive.  It was determined she needed vacuum assist vaginal delivery.  The vacuum was applied, and there was a midline episiotomy cut.  No pop-offs.  There was a third degree laceration which was repaired without problem.  There was no uterine atony, and there was no significant amount of bleeding with the third degree laceration.  The patient did have a significant amount of blood loss with the labor, and with delivery of the placenta.  Postoperative course was significant for a hemoglobin drop to as low as 6.4 on postpartum day #3.  The day before this hemoglobin had been 6.5.  She had only scant vaginal bleeding at that time.  It was determined her hemoglobin was stable, and she was begun on oral iron, and she would have it rechecked in six weeks.  She was not symptomatic from her anemia.  #2 - PREECLAMPSIA:  Regarding her preeclampsia, postpartum blood pressures were normal, and she did not have any symptoms or objective signs of preeclampsia.  She had a brisk diuresis on postpartum day #2 of approximately 5 L total to urine, and had a negative fluid balance of 2 L.  Platelets went from 104,000 to 108,000 to 136,000, after delivery.  Transaminases remained unremarkable, as did other PIH labs.  She was discharged home on postpartum day #3 with the previously mentioned discharge medications.  The patient was continued on magnesium postpartum until postpartum day #  2, when it was discontinued.  She had brisk diuresis after discontinuation of magnesium.  FOLLOWUP:  In six weeks at [facility].  Infant was discharged home with the mother. Dictated by:   Michell Heinrich, M.D. Attending Physician:  Antionette Char DD:  05/26/01 TD:  05/27/01 Job: [id number]

## 2010-12-08 NOTE — Group Therapy Note (Signed)
NAME:  Jackie Abbott                          ACCOUNT NO.:  [id number]   MEDICAL RECORD NO.:  [id number]                   PATIENT TYPE:  OUT   LOCATION:  [facility]                           FACILITY:  [facility]   PHYSICIAN:  Elsie Lincoln, MD                   DATE OF BIRTH:  March 21, 1976   DATE OF SERVICE:  08/17/2003                                    CLINIC NOTE   REASON FOR VISIT:  Postpartum follow-up and depression counseling.   SUBJECTIVE:  [name] is a 36 year old G2 P2 who had a normal vaginal  delivery of a healthy infant 2 weeks ago.  She is here to follow up on this  delivery as, according to her chart, she has a history of postpartum  depression.  In questioning her today she says that she does not have a  history of postpartum depression.  She says that she does feel like crying  sometimes but she feels this is very mild and certainly not something she  would take medicine or see a doctor for.  In probing her about this she says  that she has these feelings very rarely and they have not seemed to increase  in frequency following her pregnancy.   She was also noted in the hospital to have decreased platelets.  She was  referred at that time to heme/onc for workup of this and she has this  appointment coming up on August 20, 2003.   Otherwise, she is doing very well.  The baby is doing fine.  She has not had  any problems since her discharge.   OBJECTIVE:  VITAL SIGNS:  Temperature is 99.1, pulse 69, blood pressure  114/76, weight is 144.9.  GENERAL:  This is a healthy-appearing 35 year old woman in no acute  distress.  CHEST:  Clear to auscultation bilaterally.  HEART:  Regular and without murmurs.  ABDOMEN:  Soft and nontender with positive bowel sounds.  PELVIC:  Vaginal exam was not done today.   ASSESSMENT AND PLAN:  1. Questionable history of postpartum depression.  She was told that she     should give [place] a call if she starts having these feelings of  depression     more frequently and if she feels that she needs some help with it.  2. Thrombocytopenia.  Since she has already had the referral to heme/onc for     this and she is quite concerned about it, we will go ahead with that     referral.  3. Follow-up.  She will follow up in the [facility] for her     routine postpartum follow-up.  At that time birth control options should     be discussed.     Dante Gang, M.D.                     Elsie Lincoln, MD  JP/MEDQ  D:  08/17/2003  T:  08/17/2003  Job:  [id number]

## 2010-12-08 NOTE — Discharge Summary (Signed)
[facility]  Patient:    REBIE, PEALE Visit Number: [id number] MRN: [id number]          Service Type: MED Location: 9300 [id number] Attending Physician:  Michaelle Copas Dictated by:   Salvadore Dom, M.D. Admit Date:  05/27/2001 Discharge Date: 05/30/2001                             Discharge Summary  HISTORY OF PRESENT ILLNESS:   A 35 year old G1, P1, postpartum day 4, status post vacuum-assisted vaginal delivery, had PIH and HELLP, was seen at home by the home health nurse, had labile blood pressures.  Patient reports a frontal headache since yesterday afternoon, as well as visual changes and dizziness and reports feeling hot.  She had postpartum hemorrhage, as well as a third degree laceration.  She was admitted initially for a 23-hour observation to monitor her blood pressure with magnesium sulfate as needed.  Patients initial PIH labs showed only a mild elevation of LFTs, otherwise, normal.  She has now had some borderline elevated blood pressures in the 140s, systolic and 90s, diastolic, fever of unknown origin.  The patient was started on antibiotics for this.  Wound culture was sent.  Blood cultures were sent. These were all negative for specific organism.  Her temperature resolved by hospital day #2 and she had no further difficulty from this standpoint.  She had some breast engorgement and tenderness.  This was bilateral with no purulence and was felt unlikely to be mastitis.  Pregnancy induced hypertension as noted above.  Her blood pressures were only borderline and her LFTs were, again, on the borderline.  These resolved without any further difficulty.  The patient was not started on magnesium sulfate.  Anemia.  The patient was noted to have a hemoglobin of 6, which was down from 12 previously.  She was started on iron sulfate t.i.d., along with Dulcolax suppository and Colace.  Her H&H is to be followed on an outpatient basis.   At discharge, it had improved to 7.6 and 22.  Patient was discharged on hospital day #4.  DISCHARGE DIET:               Regular.  DISCHARGE ACTIVITY:           Pelvic rest.  DISCHARGE INSTRUCTIONS:       Wound care per instruction booklet.  DISCHARGE MEDICATIONS:        Continue her Motrin and Percocet p.r.n. pain.  DISCHARGE CONDITION:          Improved.  DISCHARGE DIAGNOSES:          1. Fever of unknown origin, resolved.                               2. Breast engorgement, also resolved.                               3. Headache, resolved.                               4. Preeclampsia, resolved. Dictated by:   Salvadore Dom, M.D. Attending Physician:  Michaelle Copas DD:  07/14/01 TD:  07/16/01 Job: [id number]

## 2011-04-24 LAB — GLUCOSE, CAPILLARY: Glucose-Capillary: 129 — ABNORMAL HIGH

## 2012-01-31 ENCOUNTER — Ambulatory Visit (INDEPENDENT_AMBULATORY_CARE_PROVIDER_SITE_OTHER): Payer: Self-pay | Admitting: Family Medicine

## 2012-01-31 VITALS — BP 107/67 | HR 74 | Temp 97.7°F | Ht 60.75 in | Wt 149.9 lb

## 2012-01-31 DIAGNOSIS — M549 Dorsalgia, unspecified: Secondary | ICD-10-CM

## 2012-01-31 MED ORDER — NAPROXEN 500 MG PO TABS: 500.0000 mg | ORAL_TABLET | Freq: Two times a day (BID) | ORAL | Status: DC

## 2012-01-31 MED ORDER — CYCLOBENZAPRINE HCL 10 MG PO TABS: 10.0000 mg | ORAL_TABLET | Freq: Three times a day (TID) | ORAL | Status: AC | PRN

## 2012-01-31 NOTE — Patient Instructions (Signed)
Thanks for coming in today.   We are going to do an x ray of your back to make sure your bones are not damaged We will also prescribe a pain medicine and a muscle relaxer.  The muscle relaxer may make you sleepy. Please return in 2 weeks. Call us immediately if you develop any weakness or fevers/sweats/chills.

## 2012-01-31 NOTE — Progress Notes (Signed)
  Subjective:    Patient ID: Jackie Abbott, female    DOB: 10-20-1975, 36 y.o.   MRN: [id number]  HPI  Patient here c/o 2 weeks of sharp/knifelike back pain located between her scapula just L of her vertebrae. No radiation. Does not hurt in the Am, but begins to hurt throughout the day. NO obvious trauma. aggravated with putting her hands above her shoulders, lifts boxes <10 lbs over her head  At work routinely. States that its getting worse over the 2 weeks, 7-8/10 pain. Tylenol/ibuprofen help some.  Alleviated by leaning forward and letting her arms hang to her feet.    Red Flags Fecal/urinary incontinence: No Numbness/Weakness: No Fever/chills/sweats:No Unexplained weight loss: No Night pain: occasionally wakes her up.  No relief with bedrest: No h/o cancer/immunosuppression: No IV drug use: No  PMH of osteoporosis or chronic steroid use: No     Review of Systems Constitutional: No weight change, fever, chills CV: No chest pain, edema, palpitations Resp: No  cough, dyspnea  GU: No dysuria, hematuria, voiding/incontinence  Issues Derm: No concerning skin lesions,  MSk: back pain, no swelling      Objective:   Physical Exam Gen: NAD, alert, cooperative with exam HEENT: NCAT, EOMI CV: RRR, no murmur Resp: CTABL, no wheezes, non-labored MSK: Back: No bony tenderness along spine, tender to palpation of paraspinal muscle on L of spine between scapulae. Mild pain ilicited with lifting L arm over head.  Skin: No rash on back, no lesions Neuro: No arm/hand weakness, Sensation on BL hands normal.        Assessment & Plan:

## 2012-01-31 NOTE — Assessment & Plan Note (Addendum)
Back pain without traumatic event but worsening over two weeks time. Most likely muscle strain/spasm, but given worsening course, I ordered an X-ray of thoracic spine.   RX for Flexeril 10mg  TID PRN with warning of sedating effects, and 500 Naproxen BID PRN for pain. Asked patient to follow up in 2 weeks if pain has not improved.   Advised patient to contact clinic immediately if any weakness or systemic signs develop.

## 2012-02-15 ENCOUNTER — Ambulatory Visit: Payer: Self-pay | Admitting: Family Medicine

## 2012-06-08 ENCOUNTER — Ambulatory Visit (INDEPENDENT_AMBULATORY_CARE_PROVIDER_SITE_OTHER): Payer: BC Managed Care – PPO | Admitting: Family Medicine

## 2012-06-08 ENCOUNTER — Observation Stay (HOSPITAL_COMMUNITY): Payer: BC Managed Care – PPO

## 2012-06-08 ENCOUNTER — Encounter (HOSPITAL_COMMUNITY): Payer: Self-pay | Admitting: *Deleted

## 2012-06-08 ENCOUNTER — Observation Stay (HOSPITAL_COMMUNITY)
Admission: EM | Admit: 2012-06-08 | Discharge: 2012-06-10 | Disposition: A | Discharge status: Home / Self Care | Payer: BC Managed Care – PPO | Attending: Internal Medicine | Admitting: Internal Medicine

## 2012-06-08 VITALS — BP 103/69 | HR 92 | Temp 98.5°F | Resp 16 | Ht 61.0 in | Wt 151.0 lb

## 2012-06-08 DIAGNOSIS — K649 Unspecified hemorrhoids: Secondary | ICD-10-CM | POA: Diagnosis present

## 2012-06-08 DIAGNOSIS — D509 Iron deficiency anemia, unspecified: Secondary | ICD-10-CM | POA: Insufficient documentation

## 2012-06-08 DIAGNOSIS — D649 Anemia, unspecified: Secondary | ICD-10-CM | POA: Diagnosis present

## 2012-06-08 DIAGNOSIS — K625 Hemorrhage of anus and rectum: Principal | ICD-10-CM | POA: Diagnosis present

## 2012-06-08 DIAGNOSIS — R82998 Other abnormal findings in urine: Secondary | ICD-10-CM | POA: Insufficient documentation

## 2012-06-08 DIAGNOSIS — D126 Benign neoplasm of colon, unspecified: Secondary | ICD-10-CM | POA: Diagnosis present

## 2012-06-08 DIAGNOSIS — N946 Dysmenorrhea, unspecified: Secondary | ICD-10-CM | POA: Insufficient documentation

## 2012-06-08 DIAGNOSIS — R7309 Other abnormal glucose: Secondary | ICD-10-CM

## 2012-06-08 DIAGNOSIS — R197 Diarrhea, unspecified: Secondary | ICD-10-CM

## 2012-06-08 DIAGNOSIS — R198 Other specified symptoms and signs involving the digestive system and abdomen: Secondary | ICD-10-CM | POA: Insufficient documentation

## 2012-06-08 DIAGNOSIS — N920 Excessive and frequent menstruation with regular cycle: Secondary | ICD-10-CM | POA: Diagnosis present

## 2012-06-08 DIAGNOSIS — K644 Residual hemorrhoidal skin tags: Secondary | ICD-10-CM | POA: Insufficient documentation

## 2012-06-08 DIAGNOSIS — R1110 Vomiting, unspecified: Secondary | ICD-10-CM

## 2012-06-08 DIAGNOSIS — R8271 Bacteriuria: Secondary | ICD-10-CM | POA: Diagnosis present

## 2012-06-08 DIAGNOSIS — R739 Hyperglycemia, unspecified: Secondary | ICD-10-CM | POA: Diagnosis present

## 2012-06-08 DIAGNOSIS — K921 Melena: Secondary | ICD-10-CM

## 2012-06-08 LAB — CBC WITH DIFFERENTIAL/PLATELET
Basophils Absolute: 0 10*3/uL (ref 0.0–0.1)
Basophils Absolute: 0 10*3/uL (ref 0.0–0.1)
Basophils Relative: 0 % (ref 0–1)
Basophils Relative: 0 % (ref 0–1)
Eosinophils Absolute: 0 10*3/uL (ref 0.0–0.7)
Eosinophils Relative: 1 % (ref 0–5)
HCT: 25.8 % — ABNORMAL LOW (ref 36.0–46.0)
Hemoglobin: 8.6 g/dL — ABNORMAL LOW (ref 12.0–15.0)
Lymphocytes Relative: 50 % — ABNORMAL HIGH (ref 12–46)
Lymphs Abs: 2.4 10*3/uL (ref 0.7–4.0)
MCH: 27.4 pg (ref 26.0–34.0)
MCHC: 33.3 g/dL (ref 30.0–36.0)
MCHC: 34.3 g/dL (ref 30.0–36.0)
MCV: 82.2 fL (ref 78.0–100.0)
Monocytes Absolute: 0.3 10*3/uL (ref 0.1–1.0)
Monocytes Relative: 6 % (ref 3–12)
Neutro Abs: 2.1 10*3/uL (ref 1.7–7.7)
Neutro Abs: 1.2 10*3/uL — ABNORMAL LOW (ref 1.7–7.7)
Neutrophils Relative %: 42 % — ABNORMAL LOW (ref 43–77)
Neutrophils Relative %: 30 % — ABNORMAL LOW (ref 43–77)
Platelets: 174 10*3/uL (ref 150–400)
RBC: 3.14 MIL/uL — ABNORMAL LOW (ref 3.87–5.11)
RDW: 12.9 % (ref 11.5–15.5)
RDW: 12.9 % (ref 11.5–15.5)
WBC: 4.9 10*3/uL (ref 4.0–10.5)

## 2012-06-08 LAB — CBC
HCT: 28.2 % — ABNORMAL LOW (ref 36.0–46.0)
HCT: 24.7 % — ABNORMAL LOW (ref 36.0–46.0)
Hemoglobin: 9.6 g/dL — ABNORMAL LOW (ref 12.0–15.0)
Hemoglobin: 8.5 g/dL — ABNORMAL LOW (ref 12.0–15.0)
MCH: 28.2 pg (ref 26.0–34.0)
MCH: 28.6 pg (ref 26.0–34.0)
MCHC: 34 g/dL (ref 30.0–36.0)
MCHC: 34.4 g/dL (ref 30.0–36.0)
MCV: 82.9 fL (ref 78.0–100.0)
MCV: 83.2 fL (ref 78.0–100.0)
Platelets: 191 10*3/uL (ref 150–400)
Platelets: 169 10*3/uL (ref 150–400)
RBC: 3.4 MIL/uL — ABNORMAL LOW (ref 3.87–5.11)
RBC: 2.97 MIL/uL — ABNORMAL LOW (ref 3.87–5.11)
RDW: 13 % (ref 11.5–15.5)
RDW: 13.1 % (ref 11.5–15.5)
WBC: 6 10*3/uL (ref 4.0–10.5)
WBC: 4.4 10*3/uL (ref 4.0–10.5)

## 2012-06-08 LAB — COMPREHENSIVE METABOLIC PANEL
ALT: 16 U/L (ref 0–35)
AST: 18 U/L (ref 0–37)
AST: 20 U/L (ref 0–37)
Albumin: 4.1 g/dL (ref 3.5–5.2)
Albumin: 3.7 g/dL (ref 3.5–5.2)
Alkaline Phosphatase: 72 U/L (ref 39–117)
Alkaline Phosphatase: 83 U/L (ref 39–117)
BUN: 12 mg/dL (ref 6–23)
BUN: 12 mg/dL (ref 6–23)
CO2: 22 mEq/L (ref 19–32)
Calcium: 8.8 mg/dL (ref 8.4–10.5)
Calcium: 8.8 mg/dL (ref 8.4–10.5)
Chloride: 105 mEq/L (ref 96–112)
Chloride: 103 mEq/L (ref 96–112)
Creatinine, Ser: 0.51 mg/dL (ref 0.50–1.10)
GFR calc Af Amer: >90 mL/min (ref >90)
GFR calc non Af Amer: >90 mL/min (ref >90)
Glucose, Bld: 115 mg/dL — ABNORMAL HIGH (ref 70–99)
Glucose, Bld: 124 mg/dL — ABNORMAL HIGH (ref 70–99)
Potassium: 4.1 mEq/L (ref 3.5–5.3)
Potassium: 4 mEq/L (ref 3.5–5.1)
Sodium: 134 mEq/L — ABNORMAL LOW (ref 135–145)
Sodium: 135 mEq/L (ref 135–145)
Total Bilirubin: 0.3 mg/dL (ref 0.3–1.2)
Total Protein: 6.5 g/dL (ref 6.0–8.3)
Total Protein: 6.8 g/dL (ref 6.0–8.3)

## 2012-06-08 LAB — URINALYSIS, ROUTINE W REFLEX MICROSCOPIC
Bilirubin Urine: NEGATIVE
Hgb urine dipstick: NEGATIVE
Specific Gravity, Urine: 1.016 (ref 1.005–1.030)
Urobilinogen, UA: 0.2 mg/dL (ref 0.0–1.0)
pH: 6 (ref 5.0–8.0)

## 2012-06-08 LAB — TROPONIN I
Troponin I: <0.3 ng/mL (ref <0.30)
Troponin I: <0.3 ng/mL (ref <0.30)

## 2012-06-08 LAB — IFOBT (OCCULT BLOOD): IFOBT: POSITIVE

## 2012-06-08 LAB — POCT CBC
Granulocyte percent: 66.4 %G (ref 37–80)
HCT, POC: 32.3 % — AB (ref 37.7–47.9)
MCV: 87.2 fL (ref 80–97)
POC Granulocyte: 4.2 (ref 2–6.9)
POC LYMPH PERCENT: 30.6 %L (ref 10–50)
RBC: 3.7 M/uL — AB (ref 4.04–5.48)
RDW, POC: 13.1 %

## 2012-06-08 LAB — PROTIME-INR
INR: 1.17 (ref 0.00–1.49)
Prothrombin Time: 14.7 seconds (ref 11.6–15.2)

## 2012-06-08 LAB — HCG, QUANTITATIVE, PREGNANCY: hCG, Beta Chain, Quant, S: <1 m[IU]/mL (ref <5)

## 2012-06-08 LAB — TYPE AND SCREEN
ABO/RH(D): O POS
Antibody Screen: NEGATIVE

## 2012-06-08 LAB — APTT: aPTT: 28 seconds (ref 24–37)

## 2012-06-08 MED ORDER — ONDANSETRON HCL 4 MG/2ML IJ SOLN
4.0000 mg | Freq: Four times a day (QID) | INTRAMUSCULAR | Status: DC | PRN
  Administered 2012-06-09: 4 mg via INTRAVENOUS
  Filled 2012-06-08: qty 2

## 2012-06-08 MED ORDER — ACETAMINOPHEN 325 MG PO TABS
650.0000 mg | ORAL_TABLET | Freq: Four times a day (QID) | ORAL | Status: DC | PRN
  Administered 2012-06-08: 650 mg via ORAL
  Filled 2012-06-08: qty 2

## 2012-06-08 MED ORDER — ONDANSETRON HCL 4 MG PO TABS: 4.0000 mg | ORAL_TABLET | Freq: Four times a day (QID) | ORAL | Status: DC | PRN

## 2012-06-08 MED ORDER — PANTOPRAZOLE SODIUM 40 MG IV SOLR
40.0000 mg | Freq: Once | INTRAVENOUS | Status: AC
  Administered 2012-06-08: 40 mg via INTRAVENOUS
  Filled 2012-06-08: qty 40

## 2012-06-08 MED ORDER — SODIUM CHLORIDE 0.9 % IV SOLN
INTRAVENOUS | Status: DC
  Administered 2012-06-09: 03:00:00 via INTRAVENOUS

## 2012-06-08 MED ORDER — SODIUM CHLORIDE 0.9 % IV SOLN
Freq: Once | INTRAVENOUS | Status: AC
  Administered 2012-06-08: 12:00:00 via INTRAVENOUS

## 2012-06-08 MED ORDER — SODIUM CHLORIDE 0.9 % IV SOLN: INTRAVENOUS | Status: DC

## 2012-06-08 MED ORDER — ACETAMINOPHEN 650 MG RE SUPP: 650.0000 mg | Freq: Four times a day (QID) | RECTAL | Status: DC | PRN

## 2012-06-08 MED ORDER — SODIUM CHLORIDE 0.9 % IJ SOLN: 3.0000 mL | Freq: Two times a day (BID) | INTRAMUSCULAR | Status: DC

## 2012-06-08 MED ORDER — PANTOPRAZOLE SODIUM 40 MG IV SOLR
40.0000 mg | Freq: Two times a day (BID) | INTRAVENOUS | Status: DC
  Administered 2012-06-08 – 2012-06-09 (×3): 40 mg via INTRAVENOUS
  Filled 2012-06-08 (×5): qty 40

## 2012-06-08 NOTE — Progress Notes (Signed)
[address]   [place], [place]  [address]   [phone]  Subjective:    Patient ID: STEPHENIE Abbott, female    DOB: [birth year], 36 y.o.   MRN: [id number]  HPIThis 36 y.o. female presents for evaluation of vomiting, bloody diarrhea.  Onset yesterday at 3:00pm with diarrhea.  Has had five episodes of diarrhea since onset.  Vomiting this morning only; one episode of vomiting; non-bloody.  No fever/chills/sweats.  Mild abdominal pain.  No similar symptoms in past.  No recent foreign travel.  No family members with similar symptoms.  No medication other than two Alkeseltzer.   Originally from [place]; [place] since 2001.  Condoms for contraception; LMP 05-23-12.   PCP: none   Review of Systems  Constitutional: Negative for fever, chills, diaphoresis and fatigue.  HENT: Negative for congestion and rhinorrhea.   Respiratory: Negative for shortness of breath.   Cardiovascular: Negative for chest pain, palpitations and leg swelling.  Gastrointestinal: Positive for nausea, vomiting, diarrhea and blood in stool. Negative for abdominal pain, constipation, anal bleeding and rectal pain.  Genitourinary: Negative for hematuria.  Skin: Negative for rash.  Neurological: Positive for dizziness and light-headedness. Negative for weakness, numbness and headaches.  Psychiatric/Behavioral: Negative for confusion.        Past Medical History  Diagnosis Date  . Gestational diabetes     No past surgical history on file.  Prior to Admission medications   Not on File    No Known Allergies  History   Social History  . Marital Status: Single    Spouse Name: N/A    Number of Children: N/A  . Years of Education: N/A   Occupational History  . Not on file.   Social History Main Topics  . Smoking status: Never Smoker   . Smokeless tobacco: Not on file  . Alcohol Use: No  . Drug Use: No  . Sexually Active: Yes   Other Topics Concern  . Not on file   Social History Narrative   Marital status:  single; living with boyfriend; from [place]; moved to [place] 2001.    Children:  3 children; no grandchildren.    Lives: with, 3 children.Employment: works at [employer]; makes sandwiches and salads    Family History  Problem Relation Age of Onset  . Diabetes Father   . Hypertension Sister     Objective:   Physical Exam  Nursing note and vitals reviewed. Constitutional: She is oriented to person, place, and time. She appears well-developed and well-nourished. No distress.  HENT:  Head: Normocephalic and atraumatic.  Mouth/Throat: Oropharynx is clear and moist.  Eyes: Conjunctivae normal are normal. Pupils are equal, round, and reactive to light.  Neck: Normal range of motion. Neck supple.  Cardiovascular: Normal rate, regular rhythm, normal heart sounds and intact distal pulses.   No murmur heard. Pulmonary/Chest: Effort normal and breath sounds normal.  Abdominal: Soft. Bowel sounds are normal. She exhibits no distension and no mass. There is no tenderness. There is no rebound and no guarding.  Genitourinary: Rectal exam shows external hemorrhoid. Rectal exam shows no fissure, no mass, no tenderness and anal tone normal. Guaiac positive stool.       Hemosure grossly positive.  Neurological: She is alert and oriented to person, place, and time. No cranial nerve deficit. She exhibits normal muscle tone. Coordination normal.  Skin: Skin is warm and dry. She is not diaphoretic.  Psychiatric: She has a normal mood and affect. Her behavior is normal.  Results for orders placed in visit on 06/08/12  IFOBT (OCCULT BLOOD)      Component Value Range   IFOBT Positive    POCT CBC      Component Value Range   WBC 6.4  4.6 - 10.2 K/uL   Lymph, poc 2.0  0.6 - 3.4   POC LYMPH PERCENT 30.6  10 - 50 %L   MID (cbc) 0.2  0 - 0.9   POC MID % 3.0  0 - 12 %M   POC Granulocyte 4.2  2 - 6.9   Granulocyte percent 66.4  37 - 80 %G   RBC 3.70 (*) 4.04 - 5.48 M/uL   Hemoglobin 9.7 (*) 12.2 - 16.2 g/dL     HCT, POC 96.0 (*) 45.4 - 47.9 %   MCV 87.2  80 - 97 fL   MCH, POC 26.2 (*) 27 - 31.2 pg   MCHC 30.0 (*) 31.8 - 35.4 g/dL   RDW, POC 09.8     Platelet Count, POC 208  142 - 424 K/uL   MPV 7.6  0 - 99.8 fL       Assessment & Plan:   1. Blood in stool  IFOBT POC (occult bld, rslt in office), POCT CBC, Comprehensive metabolic panel  2. Diarrhea    3. Vomiting    4. Anemia      1.  Bloody stools/diarrhea:  New onset in past 24 hours.  Hemoccult grossly bloody in office with new onset anemia.  To ED for likely admission to rule out active/continuous GI bleed.  Hemodynamically stable with normal orthostatics at this time.  Will warrant GI consult.  2.  Anemia:  New onset.  Last H/H in 2010 with pregnancy.  With associated bloody diarrhea; to ED for evaluation and likely admission for 24 hour observation and GI consult.Marland Kitchen

## 2012-06-08 NOTE — ED Provider Notes (Signed)
History  This chart was scribed for Raeford Razor, MD by Ladona Ridgel Day, ED scribe. This patient was seen in room TR08C/TR08C and the patient's care was started at 1028.  Language interpreter was used.  CSN: [id number]  Arrival date & time 06/08/12  1028   None     Chief Complaint  Patient presents with  . Rectal Bleeding   Patient is a 36 y.o. female presenting with hematochezia. The history is provided by the patient. A language interpreter was used.  Rectal Bleeding  The onset was gradual. The problem occurs continuously. The problem has been unchanged. The pain is moderate. The stool is described as bloody. There was no prior successful therapy. There was no prior unsuccessful therapy. Associated symptoms include abdominal pain, diarrhea (blood diarrhea), nausea and vomiting. Pertinent negatives include no fever, no vaginal bleeding, no vaginal discharge, no chest pain, no coughing and no difficulty breathing. She has been behaving normally. Her past medical history does not include recent antibiotic use. There were no sick contacts. Recently, medical care has been given at another facility. Services received include tests performed.  Jackie Abbott is a 36 y.o. female who presents to the Emergency Department complaining of rectal bleeding since yesterday with approximately 20 episodes of more blood than stool with each episode of diarrhea. She presents from [facility] and had initial blood work, 9.7 hgb, and positive guaiac, no recent antibiotic use. Language interpreter was used, she is from [place] but denies any recent visits to [place]. She states associated rectal pain and was told had hemorrhoids a year ago when having a similar problem. She states today began with one episode bloody emesis. She states dizziness, fatigue/weakness, mild abdominal pain but denies SOB, fever/chills. 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 She  has no sick contacts.  She has not seen a GI Doctor previously and denies a colonoscopy  She has no PCP  Past Medical History  Diagnosis Date  . Gestational diabetes     History reviewed. No pertinent past surgical history.  Family History  Problem Relation Age of Onset  . Diabetes Father   . Hypertension Sister     History  Substance Use Topics  . Smoking status: Never Smoker   . Smokeless tobacco: Not on file  . Alcohol Use: No    OB History    Grav Para Term Preterm Abortions TAB SAB Ect Mult Living                  Review of Systems  Constitutional: Positive for fatigue. Negative for fever and chills.  HENT: Negative for congestion.   Respiratory: Negative for cough and shortness of breath.   Cardiovascular: Negative for chest pain.  Gastrointestinal: Positive for nausea, vomiting, abdominal pain, diarrhea (blood diarrhea), blood in stool and hematochezia. Negative for constipation and abdominal distention.  Genitourinary: Negative for vaginal bleeding and vaginal discharge.  Musculoskeletal: Negative for back pain.  Neurological: Negative for syncope and weakness.  All other systems reviewed and are negative.    Allergies  Review of patient's allergies indicates no known allergies.  Home Medications  No current outpatient prescriptions on file.  Triage Vitals: BP 118/68  Pulse 90  Temp 98.8 F (37.1 C) (Oral)  Resp 16  SpO2 100%  LMP 05/23/2012  Physical Exam  Nursing note and vitals reviewed. Constitutional: She appears well-developed and well-nourished. No distress.  HENT:  Head: Normocephalic and atraumatic.  Eyes: Conjunctivae normal are normal. Right eye exhibits no discharge. Left eye exhibits no discharge.       conjunctival pallor  Neck: Neck supple.  Cardiovascular: Normal rate, regular rhythm and normal heart sounds.  Exam reveals no gallop and no friction rub.   No murmur heard. Pulmonary/Chest: Effort normal and breath sounds  normal. No respiratory distress.  Abdominal: Soft. Bowel sounds are normal. She exhibits no distension. There is no tenderness. There is no rebound and no guarding.  Genitourinary:       DRE: small nonbleeding/nonthrombosed external hemorrhoid. Normal tone. No stool in rectal vault. Gross blood noted on glove.   Musculoskeletal: She exhibits no edema and no tenderness.  Neurological: She is alert.  Skin: Skin is warm and dry.  Psychiatric: She has a normal mood and affect. Her behavior is normal. Thought content normal.    ED Course  Procedures (including critical care time) DIAGNOSTIC STUDIES: Oxygen Saturation is 100% on room air, normal by my interpretation.    COORDINATION OF CARE: At 1130 AM Discussed treatment plan with patient which includes blood work, IV fluids, protonix, ova and parasite examination, EKG, consult to internal medicine. Patient agrees.   Labs Reviewed  CBC - Abnormal; Notable for the following:    RBC 3.40 (*)     Hemoglobin 9.6 (*)     HCT 28.2 (*)     All other components within normal limits  COMPREHENSIVE METABOLIC PANEL - Abnormal; Notable for the following:    Glucose, Bld 124 (*)     All other components within normal limits  TYPE AND SCREEN  ABO/RH  OVA AND PARASITE EXAMINATION   No results found.  EKG:  Rhythm: normal sinus Rate: 86 Axis: normal Intervals: normal ST segments: NS ST changes    1. Rectal bleeding   2. Anemia       MDM  36yF with rectal bleeding. Pt describes over 20 stools that have been grossly bloody. Gross blood on exam. HD stable. No blood thinners. Possibly symptomatic with dizziness earlier today. Given this, anemia of uncertain chronicity and lack of reliable outpt follow-up, feel admission most prudent. Discussed with IM service.   I personally preformed the services scribed in my presence. The recorded information has been reviewed & is accurate. Raeford Razor, MD.          Raeford Razor,  MD 06/08/12 [phone]

## 2012-06-08 NOTE — Progress Notes (Signed)
NURSING PROGRESS NOTE  Jackie Abbott [id number] Admission Data: 06/08/2012 6:56 PM Attending Provider: Jonah Blue, DO NWG:[id number], Remi Deter, MD Code Status:     Jackie Abbott is a 36 y.o. female patient admitted from ED  No acute distress noted.  No c/o shortness of breath, no c/o chest pain.  Cardiac tele # [phone], in place, cardiac monitor yields:normal sinus rhythm.  Blood pressure 109/65, pulse 82, temperature 98.6 F (37 C), temperature source Oral, resp. rate 13, last menstrual period 05/23/2012, SpO2 100.00%.   IV Fluids:  IV in place, occlusive dsg intact without redness, IV cath antecubital left, condition patent and no redness normal saline.   Allergies:  Review of patient's allergies indicates no known allergies.  Past Medical History:   has a past medical history of Gestational diabetes.  Past Surgical History:   has no past surgical history on file.  Social History:   reports that she has never smoked. She does not have any smokeless tobacco history on file. She reports that she does not drink alcohol or use illicit drugs.  Skin: intact  Orientation to room, and floor completed with information packet given to patient/family. Admission INP armband ID verified with patient/family, and in place.   SR up x 2, fall assessment complete, with patient and family able to verbalize understanding of risk associated with falls, and verbalized understanding to call for assistance before getting out of bed.   Call light within reach. Patient able to voice and demonstrate understanding of unit orientation instructions.   Will cont to eval and treat per MD orders.  Maximina Pirozzi, Elmarie Mainland, RN

## 2012-06-08 NOTE — ED Notes (Signed)
Sent here from [facility] for diarrhea, vomiting, blood in stools, hgb 9.7. No acute distress noted at this time.

## 2012-06-08 NOTE — Patient Instructions (Addendum)
1. Blood in stool  IFOBT POC (occult bld, rslt in office), POCT CBC, Comprehensive metabolic panel  2. Diarrhea    3. Vomiting    4. Anemia       PLEASE PRESENT TO [facility] EMERGENCY DEPARTMENT FOR EVALUATION DUE TO BLOODY STOOLS AND ANEMIA.

## 2012-06-08 NOTE — ED Notes (Signed)
MD at bedside. 

## 2012-06-08 NOTE — H&P (Signed)
Hospital Admission Note Date: 06/08/2012  Patient name: Jackie Abbott Medical record number: [id number] Date of birth: 30-May-1976 Age: 36 y.o. Gender: female PCP: Kevin Fenton, MD  Medical Service: Internal Medicine Teaching Service  Attending physician:  Dr. Dalphine Handing    1st Contact: Dr. Garald Braver   Pager:[phone] 2nd Contact: Dr. Clyde Lundborg    Pager:[phone] After 5 pm or weekends: 1st Contact:      Pager: [phone] 2nd Contact:      Pager: [phone]  Chief Complaint: Bloody bowel movement  History of Present Illness: [name] is a 36 year old woman immigrant from [place] with PMH significant for gestational diabetes (not requiring insulin, in 2010), and menorragia who [facility]. She had been constipated for two days until yesterday around 3PM when she had the urge to go to the bathroom and had a bloody BM with solid stool and bright red blood filling the toilet. She reports having several episodes of bloody diarrhea since then, approximately 20. This morning she had nausea and one small amount of non-bloody emesis. She reports that her BM stopped around 7AM today.   Ten years ago she had similar symptoms and was told she had hemorrhoids. She does not recall having bloody bowel movements every since.   She denies recent travel outside the [place], recent antibiotics use. She takes Ibuprofen occasionally for headaches with less intake two weeks ago. She does not take aspirin but drinks 2-3 cups of coffee per day and drinks green tea with ginseng. Normally she goes to the the urgent care if she needs medical attention, she does not have a PCP. She denies sick contacts. She states that normally she is not constipated. Her LMP was on 05/23/12.   She denies SOB, fever/chills, headache, dizziness, or chest pain.   In the ED, her Hbg is 9.7 but there is no CBC on file since 2010, when she was pregnant of her third child. Her vitals are stable,  with no tachycardia. Gi was consulted.   Meds: No current facility-administered medications on file prior to encounter.   No current outpatient prescriptions on file prior to encounter.   Allergies: Allergies as of 06/08/2012  . (No Known Allergies)   Past Medical History  Diagnosis Date  . Gestational diabetes    History reviewed. No pertinent past surgical history. Family History  Problem Relation Age of Onset  . Diabetes Father   . Hypertension Sister    History   Social History  . Marital Status: Single    Spouse Name: N/A    Number of Children: N/A  . Years of Education: N/A   Occupational History  . Not on file.   Social History Main Topics  . Smoking status: Never Smoker   . Smokeless tobacco: Not on file  . Alcohol Use: No  . Drug Use: No  . Sexually Active: Yes    Birth Control/ Protection: Condom   Other Topics Concern  . Not on file   Social History Narrative   Marital status: single; living with boyfriend; from [place]; moved to [place] 2001.    Children:  3 children; no grandchildren.    Lives: with boyfriend, 3 children.Employment: works at [employer]; makes sandwiches and salads    Review of Systems: Pertinent items are noted in HPI.  Physical Exam: Blood pressure 107/59, pulse 82, temperature 99.1 F (37.3 C), temperature source Oral, resp. rate 13, last menstrual period 05/23/2012, SpO2 100.00%.  BP 107/59  Pulse 82  Temp 99.1 F (37.3 C) (Oral)  Resp 13  SpO2 100%  LMP 05/23/2012  General Appearance:    Alert, cooperative, no distress, appears stated age  Head:    Normocephalic, without obvious abnormality, atraumatic  Eyes:    Wearing glasses, PERRL, conjunctiva/corneas clear, EOM's intact.        Throat:   Lips, mucosa, and tongue normal; teeth and gums normal  Neck:   Supple, symmetrical, trachea midline, no adenopathy;      Back:     Symmetric, no curvature, ROM normal, no CVA tenderness  Lungs:     Clear to auscultation  bilaterally, respirations unlabored  Chest Wall:    No tenderness or deformity   Heart:    Regular rate and rhythm, II/VI SEM heard best at the LUSB, rub   or gallop     Abdomen:     Soft, non-tender, bowel sounds active all four quadrants,    no masses, no organomegaly, suprapubic tenderness.      Rectal:    Normal tone, no masses or tenderness;   Guaiac positive stool, skin tag at the anterior anal canal,  internal hemorrhoid at anterior wall (12 o'clock) with minimum serosanguineous fluid noted on exam glove.   Extremities:   Extremities normal, atraumatic, no cyanosis or edema  Pulses:   2+ and symmetric all extremities  Skin:   Skin color, texture, turgor normal, no rashes or lesions     Neurologic:   Alert and Oriented x3. CNII-XII intact, normal strength, sensation and reflexes    throughout    Lab results: Basic Metabolic Panel:  Basename 06/08/12 1038 06/08/12 0939  NA 135 134*  K 4.0 4.1  CL 103 105  CO2 22 22  GLUCOSE 124* 115*  BUN 12 12  CREATININE 0.51 0.46*  CALCIUM 8.8 8.8  MG -- --  PHOS -- --   Liver Function Tests:  [facility] 06/08/12 1038 06/08/12 0939  AST 20 18  ALT 16 16  ALKPHOS 83 72  BILITOT 0.3 0.3  PROT 6.8 6.5  ALBUMIN 3.7 4.1   CBC:  Basename 06/08/12 1038 06/08/12 0945  WBC 6.0 6.4  NEUTROABS -- --  HGB 9.6* 9.7*  HCT 28.2* 32.3*  MCV 82.9 87.2  PLT 191 --    IOther results: EKG: Normal sinus rhythm but poor R wave progression  Assessment & Plan by Problem: 36 year old woman from [place], immigrated in 2001, with PMH of gestational diabetes, menorrhagia, and hemorrhoids, presenting with bloody diarrhea x 1day.   Blood per rectum: Differential to include bleeding hemorrhoids (given patient's history of hemorrhoids and positive finding of internal hemorrhoids on physical exam ), bacterial infectious colitis with likely pathogen _Campylobacter jejuni_  (given the sudden onset, stable vitals, and resolution of her symptoms within  24hours),and AVM (albought the patient denies massive bleeding and her vitals are not consistent with massive acute blood loss). Per Dr. Allyson Sabal, in GI, this is most likely bacterial infectious colitis and the patient will likely need supportive treatment. She is afebrile at this time with tachycardia, and no leukocytosis; will not give antibiotics at this time.  -Inpatient Observation -CBC with diff now and in AM -CMET -Continue to monitory for recurrent bleeding -Stool O&P, fecal Gram Stain and culture -UA, urine culture,  -blood culture if patient becomes febrile  Anemia: Unclear if acute or if due to menorrhagia x 3 years. She currently is no tachycardic and denies shortness of breath or  dizziness. Her  -CBC now and in AM -Anemia Panel  Hyperglycemia. Patient has hyperglycemia with serum glucose of 124 in the context of decreased PO for one day. She had gestational diabetes but never had regular follow up after the birth of her last child in 2010.  -Will check Hgb A1C  Dispo: Disposition is deferred at this time, awaiting improvement of current medical problems. Anticipated discharge in approximately 1 day(s).   The patient does not have current PCP , therefore will require OPC follow-up after discharge.   The patient does not have transportation limitations that hinder transportation to clinic appointments.  Signed: Ky Barban 06/08/2012, 4:18 PM

## 2012-06-09 DIAGNOSIS — D126 Benign neoplasm of colon, unspecified: Secondary | ICD-10-CM

## 2012-06-09 DIAGNOSIS — K649 Unspecified hemorrhoids: Secondary | ICD-10-CM

## 2012-06-09 LAB — BASIC METABOLIC PANEL
Chloride: 109 mEq/L (ref 96–112)
Creatinine, Ser: 0.63 mg/dL (ref 0.50–1.10)
GFR calc Af Amer: >90 mL/min (ref >90)
Potassium: 3.8 mEq/L (ref 3.5–5.1)

## 2012-06-09 LAB — CBC
HCT: 22.4 % — ABNORMAL LOW (ref 36.0–46.0)
HCT: 25 % — ABNORMAL LOW (ref 36.0–46.0)
Hemoglobin: 7.5 g/dL — ABNORMAL LOW (ref 12.0–15.0)
MCH: 28.1 pg (ref 26.0–34.0)
MCHC: 33.6 g/dL (ref 30.0–36.0)
MCV: 83.9 fL (ref 78.0–100.0)
RBC: 2.67 MIL/uL — ABNORMAL LOW (ref 3.87–5.11)
RDW: 13 % (ref 11.5–15.5)
WBC: 2.7 10*3/uL — ABNORMAL LOW (ref 4.0–10.5)

## 2012-06-09 LAB — RETICULOCYTES
RBC.: 2.67 MIL/uL — ABNORMAL LOW (ref 3.87–5.11)
Retic Count, Absolute: 37.4 10*3/uL (ref 19.0–186.0)

## 2012-06-09 LAB — VITAMIN B12: Vitamin B-12: 551 pg/mL (ref 211–911)

## 2012-06-09 LAB — FERRITIN: Ferritin: 6 ng/mL — ABNORMAL LOW (ref 10–291)

## 2012-06-09 LAB — GLUCOSE, CAPILLARY: Glucose-Capillary: 102 mg/dL — ABNORMAL HIGH (ref 70–99)

## 2012-06-09 LAB — HEMOGLOBIN A1C
Hgb A1c MFr Bld: 5.9 % — ABNORMAL HIGH (ref <5.7)
Mean Plasma Glucose: 123 mg/dL — ABNORMAL HIGH (ref <117)

## 2012-06-09 LAB — IRON AND TIBC: TIBC: 364 ug/dL (ref 250–470)

## 2012-06-09 MED ORDER — PEG-KCL-NACL-NASULF-NA ASC-C 100 G PO SOLR
0.5000 | Freq: Once | ORAL | Status: AC
  Administered 2012-06-10: 50 g via ORAL

## 2012-06-09 MED ORDER — CIPROFLOXACIN HCL 500 MG PO TABS
500.0000 mg | ORAL_TABLET | Freq: Two times a day (BID) | ORAL | Status: DC
  Filled 2012-06-09 (×3): qty 1

## 2012-06-09 MED ORDER — CIPROFLOXACIN IN D5W 400 MG/200ML IV SOLN
400.0000 mg | Freq: Two times a day (BID) | INTRAVENOUS | Status: DC
  Administered 2012-06-09 (×2): 400 mg via INTRAVENOUS
  Filled 2012-06-09 (×5): qty 200

## 2012-06-09 MED ORDER — SODIUM CHLORIDE 0.9 % IV SOLN
INTRAVENOUS | Status: DC
  Administered 2012-06-09: 20 mL/h via INTRAVENOUS
  Administered 2012-06-10: 500 mL via INTRAVENOUS

## 2012-06-09 MED ORDER — PEG-KCL-NACL-NASULF-NA ASC-C 100 G PO SOLR
0.5000 | Freq: Once | ORAL | Status: AC
  Administered 2012-06-09: 50 g via ORAL
  Filled 2012-06-09: qty 1

## 2012-06-09 MED ORDER — PEG-KCL-NACL-NASULF-NA ASC-C 100 G PO SOLR: 1.0000 | Freq: Once | ORAL | Status: DC

## 2012-06-09 NOTE — H&P (Signed)
INTERNAL MEDICINE TEACHING SERVICE Attending Admission Note  Date: 06/09/2012  Patient name: Jackie Abbott  Medical record number: [id number]  Date of birth: 02/14/1976    I have seen and evaluated Jackie Abbott and discussed their care with the Residency Team.  36 yr. Old hispanic female with pmhx significant for gestational DM, menorrhagia, presented due to a two day history of bloody bowel movements. She states she has had about 20 bloody BM's since Saturday afternoon. She denies any abdominal pain, vomiting, but admits to nausea. She states she had a similar episode a few years back but was told it was hemorrhoids. On further questioning, she states she's always had some slight chronic lower abdominal discomfort, but it is not worse today. I obtained her history in her native language of spanish. She denies any diarrhea or further BM's since last night. She denies dizziness, SOB, palpitations.  Physical Exam: Blood pressure 111/70, pulse 81, temperature 98.4 F (36.9 C), temperature source Oral, resp. rate 18, height 5' (1.524 m), weight 158 lb 1.1 oz (71.7 kg), last menstrual period 05/23/2012, SpO2 99.00%.  General: Vital signs reviewed and noted. Well-developed, well-nourished, in no acute distress; alert, appropriate and cooperative throughout examination.  Head: Normocephalic, atraumatic.  Eyes: PERRL, EOMI, No signs of anemia or jaundince.  Nose: Mucous membranes moist, not inflammed, nonerythematous.  Throat: Oropharynx nonerythematous, no exudate appreciated.   Neck: No deformities, masses, or tenderness noted.Supple, No carotid Bruits, no JVD.  Lungs:  Normal respiratory effort. Clear to auscultation BL without crackles or wheezes.  Heart: RRR. S1 and S2 normal without gallop, murmur, or rubs.  Abdomen:  BS normoactive. Soft, Nondistended, non-tender.  No masses or organomegaly.  Extremities: No pretibial edema.  Neurologic: A&O X3, CN II - XII are grossly intact. Motor  strength is 5/5 in the all 4 extremities, Sensations intact to light touch, Cerebellar signs negative.  Skin: No visible rashes, scars.    Lab results: Results for orders placed during the hospital encounter of 06/08/12 (from the past 24 hour(s))  TROPONIN I     Status: Normal   Collection Time   06/08/12  4:22 PM      Component Value Range   Troponin I <0.30  <0.30 ng/mL  PROTIME-INR     Status: Normal   Collection Time   06/08/12  4:23 PM      Component Value Range   Prothrombin Time 14.7  11.6 - 15.2 seconds   INR 1.17  0.00 - 1.49  APTT     Status: Normal   Collection Time   06/08/12  4:23 PM      Component Value Range   aPTT 28  24 - 37 seconds  CBC WITH DIFFERENTIAL     Status: Abnormal   Collection Time   06/08/12  4:27 PM      Component Value Range   WBC 4.9  4.0 - 10.5 K/uL   RBC 3.14 (*) 3.87 - 5.11 MIL/uL   Hemoglobin 8.6 (*) 12.0 - 15.0 g/dL   HCT 57.8 (*) 46.9 - 62.9 %   MCV 82.2  78.0 - 100.0 fL   MCH 27.4  26.0 - 34.0 pg   MCHC 33.3  30.0 - 36.0 g/dL   RDW 52.8  41.3 - 24.4 %   Platelets 174  150 - 400 K/uL   Neutrophils Relative 42 (*) 43 - 77 %   Neutro Abs 2.1  1.7 - 7.7 K/uL   Lymphocytes Relative 50 (*) 12 -  46 %   Lymphs Abs 2.4  0.7 - 4.0 K/uL   Monocytes Relative 6  3 - 12 %   Monocytes Absolute 0.3  0.1 - 1.0 K/uL   Eosinophils Relative 1  0 - 5 %   Eosinophils Absolute 0.0  0.0 - 0.7 K/uL   Basophils Relative 0  0 - 1 %   Basophils Absolute 0.0  0.0 - 0.1 K/uL  HEMOGLOBIN A1C     Status: Abnormal   Collection Time   06/08/12  5:45 PM      Component Value Range   Hemoglobin A1C 5.9 (*) <5.7 %   Mean Plasma Glucose 123 (*) <117 mg/dL  CBC     Status: Abnormal   Collection Time   06/08/12  5:45 PM      Component Value Range   WBC 4.4  4.0 - 10.5 K/uL   RBC 2.97 (*) 3.87 - 5.11 MIL/uL   Hemoglobin 8.5 (*) 12.0 - 15.0 g/dL   HCT 16.1 (*) 09.6 - 04.5 %   MCV 83.2  78.0 - 100.0 fL   MCH 28.6  26.0 - 34.0 pg   MCHC 34.4  30.0 - 36.0 g/dL     RDW 40.9  81.1 - 91.4 %   Platelets 169  150 - 400 K/uL  HCG, QUANTITATIVE, PREGNANCY     Status: Normal   Collection Time   06/08/12  5:55 PM      Component Value Range   hCG, Beta Chain, Quant, S <1  <5 mIU/mL  URINALYSIS, ROUTINE W REFLEX MICROSCOPIC     Status: Normal   Collection Time   06/08/12  9:08 PM      Component Value Range   Color, Urine YELLOW  YELLOW   APPearance CLEAR  CLEAR   Specific Gravity, Urine 1.016  1.005 - 1.030   pH 6.0  5.0 - 8.0   Glucose, UA NEGATIVE  NEGATIVE mg/dL   Hgb urine dipstick NEGATIVE  NEGATIVE   Bilirubin Urine NEGATIVE  NEGATIVE   Ketones, ur NEGATIVE  NEGATIVE mg/dL   Protein, ur NEGATIVE  NEGATIVE mg/dL   Urobilinogen, UA 0.2  0.0 - 1.0 mg/dL   Nitrite NEGATIVE  NEGATIVE   Leukocytes, UA NEGATIVE  NEGATIVE  CBC WITH DIFFERENTIAL     Status: Abnormal   Collection Time   06/08/12  9:34 PM      Component Value Range   WBC 3.9 (*) 4.0 - 10.5 K/uL   RBC 2.78 (*) 3.87 - 5.11 MIL/uL   Hemoglobin 7.9 (*) 12.0 - 15.0 g/dL   HCT 78.2 (*) 95.6 - 21.3 %   MCV 82.7  78.0 - 100.0 fL   MCH 28.4  26.0 - 34.0 pg   MCHC 34.3  30.0 - 36.0 g/dL   RDW 08.6  57.8 - 46.9 %   Platelets 150  150 - 400 K/uL   Neutrophils Relative 30 (*) 43 - 77 %   Neutro Abs 1.2 (*) 1.7 - 7.7 K/uL   Lymphocytes Relative 59 (*) 12 - 46 %   Lymphs Abs 2.3  0.7 - 4.0 K/uL   Monocytes Relative 9  3 - 12 %   Monocytes Absolute 0.4  0.1 - 1.0 K/uL   Eosinophils Relative 1  0 - 5 %   Eosinophils Absolute 0.1  0.0 - 0.7 K/uL   Basophils Relative 0  0 - 1 %   Basophils Absolute 0.0  0.0 - 0.1 K/uL  TROPONIN I  Status: Normal   Collection Time   06/08/12  9:35 PM      Component Value Range   Troponin I <0.30  <0.30 ng/mL  BASIC METABOLIC PANEL     Status: Abnormal   Collection Time   06/09/12  7:04 AM      Component Value Range   Sodium 139  135 - 145 mEq/L   Potassium 3.8  3.5 - 5.1 mEq/L   Chloride 109  96 - 112 mEq/L   CO2 24  19 - 32 mEq/L   Glucose, Bld  95  70 - 99 mg/dL   BUN 11  6 - 23 mg/dL   Creatinine, Ser 4.54  0.50 - 1.10 mg/dL   Calcium 8.1 (*) 8.4 - 10.5 mg/dL   GFR calc non Af Amer >90  >90 mL/min   GFR calc Af Amer >90  >90 mL/min  CBC     Status: Abnormal   Collection Time   06/09/12  7:04 AM      Component Value Range   WBC 2.7 (*) 4.0 - 10.5 K/uL   RBC 2.67 (*) 3.87 - 5.11 MIL/uL   Hemoglobin 7.5 (*) 12.0 - 15.0 g/dL   HCT 09.8 (*) 11.9 - 14.7 %   MCV 83.9  78.0 - 100.0 fL   MCH 28.1  26.0 - 34.0 pg   MCHC 33.5  30.0 - 36.0 g/dL   RDW 82.9  56.2 - 13.0 %   Platelets 152  150 - 400 K/uL  RETICULOCYTES     Status: Abnormal   Collection Time   06/09/12  7:04 AM      Component Value Range   Retic Ct Pct 1.4  0.4 - 3.1 %   RBC. 2.67 (*) 3.87 - 5.11 MIL/uL   Retic Count, Manual 37.4  19.0 - 186.0 K/uL  GLUCOSE, CAPILLARY     Status: Abnormal   Collection Time   06/09/12  8:20 AM      Component Value Range   Glucose-Capillary 102 (*) 70 - 99 mg/dL    Imaging results:  X-ray Chest Pa And Lateral   06/08/2012  *RADIOLOGY REPORT*  Clinical Data: GI bleeding.  CHEST - 2 VIEW  Comparison: No priors.  Findings: Lung volumes are normal.  No consolidative airspace disease.  No pleural effusions.  No pneumothorax.  No pulmonary nodule or mass noted.  Pulmonary vasculature and the cardiomediastinal silhouette are within normal limits.  IMPRESSION: 1. No radiographic evidence of acute cardiopulmonary disease.   Original Report Authenticated By: Trudie Reed, M.D.      Assessment and Plan: I agree with the formulated Assessment and Plan with the following changes: 36 yr. Old hispanic female with pmhx significant for gestational DM, menorrhagia, presented due to a two day history of bloody bowel movements. 1) Hematochezia: She is noted to have H/H decrease from 9.7/32 on admission to 7.5/22 today. I would watch her H/H every 8 hours. No current indication for transfusion. Obtain iron panel, she likely has underlying iron  deficiency due to hx menorrhagia. Differential includes infectious colitis, underlying inflammatory bowel disease. Given the number of BM's she has had and drop in her H/H, I would treat empirically with Cipro for five days. Send stool culture, stool c diff. Continue IVF for now. Would keep NPO and continue IVF for now. Tomorrow may advance to liquid diet or later today if no further bloody BM's.  Jonah Blue, DO 11/18/20132:33 PM

## 2012-06-09 NOTE — Consult Note (Signed)
Referring Provider: No ref. provider found Primary Care Physician:  Kevin Fenton, MD Primary Gastroenterologist:  None  Reason for Consultation:  Rectal bleeding  HPI: Jackie Abbott is a 36 y.o. female immigrant from [place] (primarily Spanish speaking) with PMH significant for gestational diabetes (not requiring insulin, in 2010), and menorrhagia, who comes in to the [facility] following several episodes of bloody BMs. She says that the bleeding began on 11/16 around 3PM when she had the urge to go to the bathroom and passed only bright red blood filling the toilet. She reports having several episodes of the same, approximately 20, until the bleeding ceased yesterday morning 11/17 around 7 AM.  She had nausea and one small amount of non-bloody emesis yesterday.  Ten years ago she had similar symptoms and was told she had hemorrhoids. She does not recall having bloody bowel movements since that time.  She denies recent travel outside the [place]; no recent antibiotic use. She takes Ibuprofen occasionally for headaches.  She denies sick contacts. She states that she usually has "normal" bowel movements. Her LMP was on 05/23/12.  She denies abdominal pain, SOB, fever/chills, headache, dizziness, chest pain, decreased appetite or weight loss.  Prior to coming to the ED she was at an Urgent Care where rectal exam demonstrated external hemorrhoids and grossly heme positive stool; she was sent to the ED for further evaluation.  In the ED, her Hbg is 9.7 but there is no CBC on file since 2010, when she was pregnant of her third child (was 12.5 grams at that time).  During this hospitalization her Hgb was been trending down and is 7.5 grams this AM.  Is receiving IVF's at 75 mL/hr, and she was placed on IV cipro empirically.   Past Medical History  Diagnosis Date  . Gestational diabetes     History reviewed. No pertinent past surgical history.  Prior to Admission medications   Not on File    Current  Facility-Administered Medications  Medication Dose Route Frequency Provider Last Rate Last Dose  . [COMPLETED] 0.9 %  sodium chloride infusion   Intravenous Once Raeford Razor, MD      . 0.9 %  sodium chloride infusion   Intravenous Continuous Lorretta Harp, MD 75 mL/hr at 06/09/12 0254    . acetaminophen (TYLENOL) tablet 650 mg  650 mg Oral Q6H PRN Lorretta Harp, MD   650 mg at 06/08/12 2047   Or  . acetaminophen (TYLENOL) suppository 650 mg  650 mg Rectal Q6H PRN Lorretta Harp, MD      . ciprofloxacin (CIPRO) IVPB 400 mg  400 mg Intravenous Q12H Lorretta Harp, MD   400 mg at 06/09/12 0913  . ondansetron (ZOFRAN) tablet 4 mg  4 mg Oral Q6H PRN Lorretta Harp, MD       Or  . ondansetron [facility]) injection 4 mg  4 mg Intravenous Q6H PRN Lorretta Harp, MD      . pantoprazole (PROTONIX) injection 40 mg  40 mg Intravenous Q12H Lorretta Harp, MD   40 mg at 06/09/12 1044  . sodium chloride 0.9 % injection 3 mL  3 mL Intravenous Q12H Lorretta Harp, MD      . [DISCONTINUED] 0.9 %  sodium chloride infusion   Intravenous Continuous Lorretta Harp, MD      . [DISCONTINUED] ciprofloxacin (CIPRO) tablet 500 mg  500 mg Oral BID Lorretta Harp, MD        Allergies as of 06/08/2012  . (No Known Allergies)  Family History  Problem Relation Age of Onset  . Diabetes Father   . Hypertension Sister     History   Social History  . Marital Status: Single    Spouse Name: N/A    Number of Children: N/A  . Years of Education: N/A   Occupational History  . Not on file.   Social History Main Topics  . Smoking status: Never Smoker   . Smokeless tobacco: Not on file  . Alcohol Use: No  . Drug Use: No  . Sexually Active: Yes    Birth Control/ Protection: Condom   Other Topics Concern  . Not on file   Social History Narrative   Marital status: single; living with boyfriend; from [place]; moved to [place] 2001.    Children:  3 children; no grandchildren.    Lives: with boyfriend, 3 children.Employment: works at [employer]; makes sandwiches  and salads    Review of Systems: Ten point ROS is O/W negative except as mentioned in HPI.  Physical Exam: Vital signs in last 24 hours: Temp:  [98.4 F (36.9 C)-99.1 F (37.3 C)] 98.4 F (36.9 C) (11/18 0432) Pulse Rate:  [81-94] 81  (11/18 0432) Resp:  [13-21] 18  (11/18 0432) BP: (99-124)/(59-80) 111/70 mmHg (11/18 0432) SpO2:  [96 %-100 %] 99 % (11/18 0432) Weight:  [150 lb 5.7 oz (68.2 kg)-158 lb 1.1 oz (71.7 kg)] 158 lb 1.1 oz (71.7 kg) (11/18 0432) Last BM Date: 06/08/12 General:   Alert,  Well-developed, well-nourished, pleasant and cooperative in NAD. Head:  Normocephalic and atraumatic. Eyes:  Sclera clear, no icterus.  Conjunctiva pink. Ears:  Normal auditory acuity. Mouth:  No deformity or lesions.   Lungs:  Clear throughout to auscultation.  No wheezes, crackles, or rhonchi. Heart:  Regular rate and rhythm; no murmurs, clicks, rubs,  or gallops. Abdomen:  Soft, nontender, BS active, nonpalp mass or hsm.   Rectal:  Deferred.  Exam at urgent care showed external hemorrhoid and heme positive stool (grossly positive).  Msk:  Symmetrical without gross deformities. Pulses:  Normal pulses noted. Extremities:  Without clubbing or edema. Neurologic:  Alert and  oriented x4;  grossly normal neurologically. Skin:  Intact without significant lesions or rashes. Psych:  Alert and cooperative. Normal mood and affect.  Intake/Output from previous day: 11/17 0701 - 11/18 0700 In: 758.8 [I.V.:748.8; IV Piggyback:10] Out: -   Lab Results:  [facility] 06/09/12 0704 06/08/12 2134 06/08/12 1745  WBC 2.7* 3.9* 4.4  HGB 7.5* 7.9* 8.5*  HCT 22.4* 23.0* 24.7*  PLT 152 150 169   BMET  Basename 06/09/12 0704 06/08/12 1038 06/08/12 0939  NA 139 135 134*  K 3.8 4.0 4.1  CL 109 103 105  CO2 24 22 22   GLUCOSE 95 124* 115*  BUN 11 12 12   CREATININE 0.63 0.51 0.46*  CALCIUM 8.1* 8.8 8.8   LFT  Basename 06/08/12 1038  PROT 6.8  ALBUMIN 3.7  AST 20  ALT 16  ALKPHOS 83    BILITOT 0.3  BILIDIR --  IBILI --   PT/INR  Basename 06/08/12 1623  LABPROT 14.7  INR 1.17   Studies/Results: X-ray Chest Pa And Lateral   06/08/2012  *RADIOLOGY REPORT*  Clinical Data: GI bleeding.  CHEST - 2 VIEW  Comparison: No priors.  Findings: Lung volumes are normal.  No consolidative airspace disease.  No pleural effusions.  No pneumothorax.  No pulmonary nodule or mass noted.  Pulmonary vasculature and the cardiomediastinal silhouette are within normal  limits.  IMPRESSION: 1. No radiographic evidence of acute cardiopulmonary disease.   Original Report Authenticated By: Trudie Reed, M.D.     IMPRESSION:  -Self limited rectal bleeding.  Likely hemorrhoidal, but other etiologies not excluded. -Anemia:  Likely acute on chronic.  Do not have a recent baseline Hgb.  She does have heavy periods as well.   PLAN: -Colonoscopy tomorrow AM, 11/19. -Monitor Hgb and transfuse if <7 grams. -Can likely D/C antibiotics.  ZEHR, JESSICA D.  06/09/2012, 1:59 PM  Pager number [phone]      ________________________________________________________________________  [facility] MD note:  I personally examined the patient, reviewed the data and agree with the assessment and plan described above.  ACute, probably stopped lower GI bleeding. Will prep for colonsocopy tomorrow. Her menorrhagia should also be evaluated.   Rob Bunting, MD [facility] Pager [phone]

## 2012-06-09 NOTE — Progress Notes (Signed)
Subjective: She has had no BM since yesterday at 7AM. She denies nausea or vomiting. She has had dizziness upon standing. She denies chest pain, shortness of breath, or overt bleeding. She continues to experience tenesmus. Objective: Vital signs in last 24 hours: Filed Vitals:   06/08/12 2001 06/08/12 2003 06/08/12 2217 06/09/12 0432  BP: 124/79 118/76 99/62 111/70  Pulse: 90 94 81 81  Temp:   98.4 F (36.9 C) 98.4 F (36.9 C)  TempSrc:   Oral Oral  Resp: 18 18 20 18   Height:  5' (1.524 m)    Weight:  150 lb 5.7 oz (68.2 kg)  158 lb 1.1 oz (71.7 kg)  SpO2:   96% 99%   Weight change:   Intake/Output Summary (Last 24 hours) at 06/09/12 1112 Last data filed at 06/09/12 0645  Gross per 24 hour  Intake 758.75 ml  Output      0 ml  Net 758.75 ml   Vitals reviewed. General: Facial pallor (not noted yesterday), resting in bed, in NAD HEENT: no scleral icterus Cardiac: RRR, II/VI SEM heard best at the LUSB, no gallops Pulm: clear to auscultation bilaterally, no wheezes, rales, or rhonchi Abd: soft, nontender, nondistended, BS present Ext: warm and well perfused, no pedal edema Neuro: alert and oriented X3, cranial nerves II-XII grossly intact, strength and sensation to light touch equal in bilateral upper and lower extremities Skin: No rashes  Lab Results: Basic Metabolic Panel:  Lab 06/09/12 1610 06/08/12 1038  NA 139 135  K 3.8 4.0  CL 109 103  CO2 24 22  GLUCOSE 95 124*  BUN 11 12  CREATININE 0.63 0.51  CALCIUM 8.1* 8.8  MG -- --  PHOS -- --   Liver Function Tests:  Lab 06/08/12 1038 06/08/12 0939  AST 20 18  ALT 16 16  ALKPHOS 83 72  BILITOT 0.3 0.3  PROT 6.8 6.5  ALBUMIN 3.7 4.1   CBC:  Lab 06/09/12 0704 06/08/12 2134 06/08/12 1627  WBC 2.7* 3.9* --  NEUTROABS -- 1.2* 2.1  HGB 7.5* 7.9* --  HCT 22.4* 23.0* --  MCV 83.9 82.7 --  PLT 152 150 --   Cardiac Enzymes:  Lab 06/08/12 2135 06/08/12 1622  CKTOTAL -- --  CKMB -- --  CKMBINDEX -- --    TROPONINI <0.30 <0.30   CBG:  Lab 06/09/12 0820  GLUCAP 102*   Hemoglobin A1C:  Lab 06/08/12 1745  HGBA1C 5.9*   Coagulation:  Lab 06/08/12 1623  LABPROT 14.7  INR 1.17   Anemia Panel:  Lab 06/09/12 0704  VITAMINB12 --  FOLATE --  FERRITIN --  TIBC --  IRON --  RETICCTPCT 1.4   Urinalysis:  Lab 06/08/12 2108  COLORURINE YELLOW  LABSPEC 1.016  PHURINE 6.0  GLUCOSEU NEGATIVE  HGBUR NEGATIVE  BILIRUBINUR NEGATIVE  KETONESUR NEGATIVE  PROTEINUR NEGATIVE  UROBILINOGEN 0.2  NITRITE NEGATIVE  LEUKOCYTESUR NEGATIVE    Micro Results: No results found for this or any previous visit (from the past 240 hour(s)). Studies/Results: X-ray Chest Pa And Lateral   06/08/2012  *RADIOLOGY REPORT*  Clinical Data: GI bleeding.  CHEST - 2 VIEW  Comparison: No priors.  Findings: Lung volumes are normal.  No consolidative airspace disease.  No pleural effusions.  No pneumothorax.  No pulmonary nodule or mass noted.  Pulmonary vasculature and the cardiomediastinal silhouette are within normal limits.  IMPRESSION: 1. No radiographic evidence of acute cardiopulmonary disease.   Original Report Authenticated By: Trudie Reed, M.D.  Medications: I have reviewed the patient's current medications. Scheduled Meds:   . [COMPLETED] sodium chloride   Intravenous Once  . ciprofloxacin  400 mg Intravenous Q12H  . [COMPLETED] pantoprazole (PROTONIX) IV  40 mg Intravenous Once  . pantoprazole (PROTONIX) IV  40 mg Intravenous Q12H  . sodium chloride  3 mL Intravenous Q12H  . [DISCONTINUED] ciprofloxacin  500 mg Oral BID   Continuous Infusions:   . sodium chloride 75 mL/hr at 06/09/12 0254  . [DISCONTINUED] sodium chloride     PRN Meds:.acetaminophen, acetaminophen, ondansetron (ZOFRAN) IV, ondansetron Assessment/Plan: 36 year old woman from [place], immigrated in 2001, with PMH of gestational diabetes, menorrhagia, and hemorrhoids, presenting with bloody diarrhea x 1day.    Hematochezia: Differential to include bleeding hemorrhoids (given patient's history of hemorrhoids and positive finding of internal hemorrhoids on physical exam ), bacterial infectious colitis with likely pathogen _Campylobacter jejuni_ (given the sudden onset, stable vitals, and resolution of her symptoms within 24hours),and AVM (albought the patient denies massive bleeding and her vitals are not consistent with massive acute blood loss), IBD (though her symptoms are acute and she never had abdominal symptoms prior to this encounter), and diverticulitis (thought she is young with no history of diverticulosis and currently no LLQ pain) . Per Dr. Allyson Sabal, in GI, this is most likely bacterial infectious colitis and the patient will likely need supportive treatment. She is afebrile at this time with tachycardia, and no leukocytosis.   -GI consulted, appreciate recommendations. Colonoscopy tomorrow morning -CBC with diff q 8hours -CMET in AM -NPO, will advance to liquid diet tomorrow if no overt bleeding -Start Cipro IV empirically for empirical bacterial infective colitis--she will need 5 days of Abx -Continue to monitor for recurrent bleeding  -Stool O&P, fecal Gram Stain and culture  -UA, urine culture,  -blood culture if patient becomes febrile  -Type and screen already ordered.   Tenesmus: This is likely secondary to internal hemorrhoid but tumor/mass cannot be excluded at this time. GI has been consulted, will follow recommendations.   Anemia: Unclear if acute or if due to menorrhagia x 3 years. She currently has no tachycardic and denies shortness of breath or dizziness. Her hemoglobin has dropped from 8.5-->7.9-->7.5 this morning. However, WBC is also suppressed, this could be a dilutional component.  - CBC q 8h  - f/u Anemia Panel  - Will transfuse if Hg<7 and patient is symptomatic  Hyperglycemia. Patient had hyperglycemia on admission with serum glucose of 124 in the context of decreased  PO for one day. She had gestational diabetes but never had regular follow up after the birth of her last child in 2010. Her HgbA1C is 5.9 (prediabetes range)   Dispo: Disposition is deferred at this time, awaiting improvement of current medical problems. Anticipated discharge in approximately 2 day(s).  The patient does not have current PCP , therefore will require OPC follow-up after discharge.  The patient does not have transportation limitations that hinder transportation to clinic appointments.     LOS: 1 day   Ky Barban 06/09/2012, 11:12 AM

## 2012-06-10 ENCOUNTER — Encounter (HOSPITAL_COMMUNITY)
Admission: EM | Disposition: A | Discharge status: Home / Self Care | Payer: Self-pay | Source: Home / Self Care | Attending: Emergency Medicine

## 2012-06-10 ENCOUNTER — Encounter (HOSPITAL_COMMUNITY): Payer: Self-pay | Admitting: *Deleted

## 2012-06-10 DIAGNOSIS — R8271 Bacteriuria: Secondary | ICD-10-CM | POA: Diagnosis present

## 2012-06-10 DIAGNOSIS — D126 Benign neoplasm of colon, unspecified: Secondary | ICD-10-CM | POA: Diagnosis present

## 2012-06-10 DIAGNOSIS — K649 Unspecified hemorrhoids: Secondary | ICD-10-CM | POA: Diagnosis present

## 2012-06-10 HISTORY — PX: COLONOSCOPY: SHX5424

## 2012-06-10 LAB — CBC
HCT: 24.4 % — ABNORMAL LOW (ref 36.0–46.0)
Hemoglobin: 8.1 g/dL — ABNORMAL LOW (ref 12.0–15.0)
MCH: 28.6 pg (ref 26.0–34.0)
MCHC: 33.2 g/dL (ref 30.0–36.0)
MCV: 84.3 fL (ref 78.0–100.0)
MCV: 84.5 fL (ref 78.0–100.0)
Platelets: 186 10*3/uL (ref 150–400)
Platelets: 196 10*3/uL (ref 150–400)
RBC: 3.09 MIL/uL — ABNORMAL LOW (ref 3.87–5.11)
RBC: 3.25 MIL/uL — ABNORMAL LOW (ref 3.87–5.11)
RDW: 13.1 % (ref 11.5–15.5)
RDW: 13.1 % (ref 11.5–15.5)
WBC: 3.6 10*3/uL — ABNORMAL LOW (ref 4.0–10.5)
WBC: 4.3 10*3/uL (ref 4.0–10.5)

## 2012-06-10 LAB — HIV ANTIBODY (ROUTINE TESTING W REFLEX): HIV: NONREACTIVE

## 2012-06-10 LAB — URINE CULTURE: Colony Count: 10000

## 2012-06-10 LAB — HEPATIC FUNCTION PANEL
AST: 23 U/L (ref 0–37)
Albumin: 3.6 g/dL (ref 3.5–5.2)
Alkaline Phosphatase: 75 U/L (ref 39–117)
Total Bilirubin: 0.3 mg/dL (ref 0.3–1.2)
Total Protein: 6.9 g/dL (ref 6.0–8.3)

## 2012-06-10 SURGERY — COLONOSCOPY
Anesthesia: Moderate Sedation

## 2012-06-10 MED ORDER — FENTANYL CITRATE 0.05 MG/ML IJ SOLN
INTRAMUSCULAR | Status: AC
  Filled 2012-06-10: qty 2

## 2012-06-10 MED ORDER — MIDAZOLAM HCL 5 MG/ML IJ SOLN
INTRAMUSCULAR | Status: AC
  Filled 2012-06-10: qty 2

## 2012-06-10 MED ORDER — POLYETHYLENE GLYCOL 3350 17 G PO PACK
17.0000 g | PACK | Freq: Every day | ORAL | Status: DC | PRN
  Filled 2012-06-10: qty 1

## 2012-06-10 MED ORDER — POLYETHYLENE GLYCOL 3350 17 G PO PACK: 17.0000 g | PACK | Freq: Every day | ORAL | Status: DC | PRN

## 2012-06-10 MED ORDER — DOCUSATE SODIUM 100 MG PO CAPS
100.0000 mg | ORAL_CAPSULE | Freq: Two times a day (BID) | ORAL | Status: DC
  Administered 2012-06-10: 100 mg via ORAL
  Filled 2012-06-10 (×2): qty 1

## 2012-06-10 MED ORDER — HYDROCORTISONE 1 % EX CREA: TOPICAL_CREAM | CUTANEOUS | Status: DC | PRN

## 2012-06-10 MED ORDER — DSS 100 MG PO CAPS: 100.0000 mg | ORAL_CAPSULE | Freq: Two times a day (BID) | ORAL | Status: DC

## 2012-06-10 MED ORDER — FERROUS SULFATE 325 (65 FE) MG PO TABS
325.0000 mg | ORAL_TABLET | Freq: Three times a day (TID) | ORAL | Status: DC
  Administered 2012-06-10: 325 mg via ORAL
  Filled 2012-06-10 (×2): qty 1

## 2012-06-10 MED ORDER — HYDROCORTISONE 1 % EX CREA
TOPICAL_CREAM | CUTANEOUS | Status: DC | PRN
  Filled 2012-06-10: qty 28

## 2012-06-10 MED ORDER — FERROUS SULFATE 325 (65 FE) MG PO TABS: 325.0000 mg | ORAL_TABLET | Freq: Three times a day (TID) | ORAL | Status: DC

## 2012-06-10 MED ORDER — FERROUS SULFATE 325 (65 FE) MG PO TABS
325.0000 mg | ORAL_TABLET | Freq: Every day | ORAL | Status: DC
  Filled 2012-06-10: qty 1

## 2012-06-10 MED ORDER — FENTANYL CITRATE 0.05 MG/ML IJ SOLN
INTRAMUSCULAR | Status: DC | PRN
  Administered 2012-06-10 (×3): 25 ug via INTRAVENOUS

## 2012-06-10 MED ORDER — MIDAZOLAM HCL 5 MG/ML IJ SOLN
INTRAMUSCULAR | Status: AC
  Filled 2012-06-10: qty 3

## 2012-06-10 MED ORDER — MIDAZOLAM HCL 5 MG/5ML IJ SOLN
INTRAMUSCULAR | Status: DC | PRN
  Administered 2012-06-10 (×3): 2 mg via INTRAVENOUS

## 2012-06-10 NOTE — Progress Notes (Signed)
Spoke with MD on call who stated that it was okay for patient to get moviprep at 0400 even though she's supposed to be NPO. She just can't eat anything.

## 2012-06-10 NOTE — Care Management Note (Signed)
    Page 1 of 1   06/10/2012     3:22:38 PM   CARE MANAGEMENT NOTE 06/10/2012  Patient:  Jackie Abbott, Jackie Abbott   Account Number:  [id number]  Date Initiated:  06/10/2012  Documentation initiated by:  Letha Cape  Subjective/Objective Assessment:   dx gib  admit as observation     Action/Plan:   Anticipated DC Date:  06/10/2012   Anticipated DC Plan:  HOME/SELF CARE      DC Planning Services  CM consult      Choice offered to / List presented to:             Status of service:  Completed, signed off Medicare Important Message given?   (If response is "NO", the following Medicare IM given date fields will be blank) Date Medicare IM given:   Date Additional Medicare IM given:    Discharge Disposition:  HOME/SELF CARE  Per UR Regulation:  Reviewed for med. necessity/level of care/duration of stay  If discussed at Long Length of Stay Meetings, dates discussed:    Comments:  06/10/12 15:21 Letha Cape RN, BSN [phone] patient has medication coverage and transportation.  PTA independent.  No needs anticipated.

## 2012-06-10 NOTE — Progress Notes (Signed)
Pt was started on the second half of her moviprep

## 2012-06-10 NOTE — Progress Notes (Signed)
1700 Discharge instructions reviewed with patient verbalize and understand. Skin WNL

## 2012-06-10 NOTE — Discharge Instructions (Signed)
Hemorragia uterina disfuncional (Abnormal Uterine Bleeding) La hemorragia uterina disfuncional puede tener muchas causas. En algunos casos se mejora simplemente con un tratamiento. En otros casos puede ser ms grave. Hay varios tipos de hemorragia que se consideran disfuncionales. Ellas son:  Minda Meo perodos.  Menands.  Prdida de sangre en cualquier momento del ciclo menstrual.  Hemorragia abundante o ms que lo habitual.  Sangrado luego de la menopausia. CAUSAS Hay numerosas causas que originan este problema. Puede ocurrir en adolescentes, mujeres embarazadas, en aquellas que se encuentran en su vida reproductiva y en las que han llegado a la menopausia. El mdico buscar las causas ms comunes, de acuerdo a su edad, los signos y los sntomas que presenta y su Medical laboratory scientific officer. En la [employer] no reviste gravedad y se trata sin dificultad. An en los casos ms graves, como el cncer en los rganos femeninos. puede tratarse adecuadamente si se detecta en etapas tempranas. Es por eso que todos los tipos de hemorragia deben evaluarse y tratarse lo antes posible. DIAGNSTICO El diagnstico de la causa puede requerir varios tipos de pruebas. El profesional podr:  Optometrist una historia completa del tipo de hemorragia.  Realizar un examen fsico completo y un papanicolau.  Indicar una ecografa de abdomen que muestre una imagen de los rganos femeninos y de la pelvis.  Podr inyectar una sustancia de contraste en el tero y las trompas de [place] y tomar radiografas (histerosalpingografa).  Inyectar lquido en el tero para realizar una ecografa (sonohisterografa)  Indicar una tomografa computada para examinar los rganos femeninos y la pelvis.  Indicar una resonancia magntica para examinar los rganos femeninos y la pelvis. Este procedimiento no implica la aplicacin de rayos X.  Observar el interior del tero con un  dispositivo similar a un telescopo que tiene una luz en un extremo (histeroscopio).  Realizar un raspado del tero para obtener muestras de tejido y examinarlas (dilatacin y curetaje).  Observar el interior de la pelvis con un dispositivo similar a un telescopio que tiene una luz en un extremo (laparoscopio). Esto se lleva a cabo a travs de un corte muy pequeo (incisin) en el abdomen. TRATAMIENTO El tratamiento depender de la causa. Podr incluir:  No tomar medidas y dejar que el problema se solucione por s mismo.  Tratamiento hormonal.  Pldoras anticonceptivas.  Tratamiento del problema mdico que causa la hemorragia.  Laparoscopa:  Ciruga mayor o menor.  Destruccin del tapizado interno de la pared del tero con corriente elctrica, rayo lser, congelamiento o calor (ablacin uterina). [place] recomendaciones de su mdico acerca de cmo tratar el problema.  Consulte con su mdico si no tiene su perodo menstrual y piensa que puede estar [place].  Si presenta una hemorragia abundante, cuente el nmero de apsitos o tampones que [place] y con qu frecuencia debe cambiarlos. Convrselo con el profesional que la asiste.  Evite las relaciones sexuales hasta que el problema sea controlado. SOLICITE ATENCIN MDICA SI:  Sufre algn tipo de hemorragia disfuncional como las ya mencionadas.  Se siente mareada.  Tiene ms de 8 aos y an no ha tenido su perodo menstrual. SOLICITE ATENCIN MDICA DE INMEDIATO SI:  Se desmaya.  Debe cambiarse el apsito o el tampn cada 15 a 30 minutos.  Siente dolor abdominal.  La temperatura se eleva por encima de 100 F (37.8 C).  Philbert Riser o se siente dbil.  Elimina cogulos grandes por la vagina.  Si tiene  malestar estomacal (nuseas) y vmitos. Document Released: 07/09/2005 Document Revised: 10/01/2011 [facility] Patient Information 2013 [place], [place]. Anemia, No  Especfica (Anemia, Nonspecific) Sus pruebas y exmenes de sangre muestran que usted tiene anemia. Esto significa que su nivel de hemoglobina (sangre) es demasiado bajo. Los valores normales de hemoglobina son 12 a 15 en mujeres y 14 a 17 en hombres. Tome nota de su nivel de hemoglobina en la actualidad. Tambin se utiliza el porcentaje de hematocritos para medir la anemia. Un nivel normal de hematocritos es 38 a 46 en mujeres y 42 a 49 en hombres. Tome nota de su porcentaje de hematocritos en la actualidad. CAUSAS La anemia puede deberse a muchas causas diferentes.  Sangrado excesivo del perodo menstrual (en mujeres).  Hemorragia intestinal.  Dficit nutricional.  Enfermedades renales, de la tiroides, hepticas y de la mdula sea. SINTOMAS La anemia puede ocurrir repentinamente [place]on Foley). Tambin puede ocurrir lentamente (crnica). Los sntomas incluyen:  Ddebilidad leve.  Mareos.  Palpitaciones.  Falta de aire. Es posible que no tenga sntomas hasta que falte la mitad de hemoglobina, si se instala lent+amente. Es posible que sea necesario realizar una transfusin en el caso de que haya ocurrido una prdida importante y repentina de sangre debido a una herida o a una hemorragia interna. Es necesaria la atencin hospitalaria si usted est anmico y Financial risk analyst una prdida de sangre significativa. TRATAMIENTO  A menudo son necesarios exmenes de la materia fecal para Engineer, manufacturing sangre (Hemoccult) y otros exmenes adicionales. Esto determina Consulting civil engineer.  Es [employer] importante que se siga evaluando su trastorno y su respuesta al tratamiento. A menudo lleva muchas semanas lograr corregir la anemia. Segn la causa, el tratamiento puede incluir:  Suplementos de hierro.  Vitamina B12 y cido flico.  Medicamentos hormonales.Si su anemia se debe a un sangrado, es [employer] causa de la prdida de [place]. Esto ayudar a Teacher, music. SOLICITE ATENCIN MDICA DE  INMEDIATO SI:  Se desmaya, se siente muy dbil, le falta el aire o siente dolor en el pecho.  Desarrolla un sangrado vaginal.  Sus heces son sanguinolentas, negras o alquitranadas, o vomita sangre.  Desarrolla fiebre alta, sarpullidos, vmitos a repeticin o deshidratacin. Document Released: 07/09/2005 Document Revised: 10/01/2011 [facility] Patient Information 2013 [place], [place]. Sangre en la materia fecal  (Bloody Stools)  Usted ha observado que haba sangre en la materia fecal. Esto es signo de que hay algn problema en el sistema digestivo. Es importante que su mdico encuentre la causa del sangrado, de modo que pueda tratar el problema.  CUIDADOS EN EL HOGAR   Slo tome la medicacin segn las indicaciones.  Consuma alimentos ricos en fibra (ciruelas, salvado).  Beba gran cantidad de lquido para mantener la orina de tono claro o color amarillo plido.  Sintese en agua caliente (bao de asiento) durante 10 a 15 minutos segn las indicaciones del mdico.  Averige como [employer] medicamentos (enemas, supositorios) que le haya indicado el mdico.  Observe los signos para ver si mejora o empeora. SOLICITE AYUDA DE INMEDIATO SI:   No se siente mejor.  Comienza a sentirse mejor pero empeora nuevamente.  Tiene nuevos problemas.  Tiene una hemorragia abundante por el ano (recto) que no se detiene.  Vomita sangre.  Se siente dbil o se desvanece .(se desmaya).  Tiene fiebre. ASEGRESE DE QUE:   Comprende estas instrucciones.  Controlar su enfermedad.  Solicitar ayuda de inmediato si no mejora o si empeora. Document Released: 01/22/2011 Document Revised: 10/01/2011 [facility] Patient  Information 2013 [place], [place]. Plipos en el colon  (Colon Polyps) Los plipos son masas de tejido que crecen dentro del cuerpo. Los plipos pueden desarrollarse en el intestino grueso (colon). La mayora de los plipos son no cancerosos (benignos). Sin embargo, algunos plipos  pueden convertirse en cancerosos con el tiempo. Los plipos que sean ms grandes que un guisante pueden ser [employer]. Para estar seguros, los mdicos extirpan y [employer] plipos.  CAUSAS  Se forman cuando ciertas mutaciones genticas hacen que las clulas se desarrollen y se dividan por dems.  FACTORES DE RIESGO  Hay un nmero de factores de riesgo que pueden aumentar las probabilidades de padecer plipos en el colon. Ellos son:   Ser mayor de 50 aos de edad.  Historia familiar de cncer o plipos de colon.  Ciertas enfermedades crnicas como la colitis o la enfermedad de Crohn.  Tener sobrepeso.  El hbito de fumar.  El sedentarismo.  Beber alcohol en exceso. SNTOMAS  La mayor parte de los plipos no causa sntomas. Si se presentan sntomas, stos pueden ser:   [employer] materia fecal. Heces de color rojo oscuro o negro.  Constipacin o diarrea que duran ms de 1 semana. DIAGNSTICO  [place] persona con frecuencia no sabe que tiene plipos [employer] su mdico los halla durante un examen fsico de [place]. El mdico puede usar 4 tipos de pruebas para Engineer, manufacturing plipos:   Examen Ecologist. Se colocar guantes y palpar el interior del recto. Esta prueba detectar solo plipos en el recto.  Enema de bario. El mdico introduce un lquido llamado bario en el recto y luego toma radiografas del colon. El bario hace que el colon se vea blanco. Los plipos son de color oscuro, por lo que son fciles de Landscape architect.  Sigmoideoscopia. Se coloca un tubo delgado y flexible (sigmoideoscopio) en el recto. Este sigmoideoscopio tiene [place] fuente de luz y Neomia Dear pequea cmara de video. El mdico [place] el sigmoideoscopio para observar el ltimo tercio del colon.  Colonoscopa. Esta prueba es similar a la sigmoideoscopia, pero el mdico examina todo el colon. Este es el mtodo ms frecuente para Engineer, manufacturing y extirpar los plipos. TRATAMIENTO  Todo plipo ser extirpado Franco Nones sigmoideoscopa o una colonoscopa. Luego se analizan para Clinical cytogeneticist.  PREVENCIN  Para disminuir los riesgos de volver a Warehouse manager plipos en el colon:   Coma mucha fruta y verdura fresca. Evite las comidas grasas.  No fume.  Evite consumir alcohol.  Haga ejercicios [employer].  Baje de peso segn las indicaciones de su mdico.  Consuma mucho clcio y folato. Las comidas que contienen calcio son la [place], los quesos y el brcoli. Las comidas que contienen folato son los garbanzos, los frijoles rojos y Conservation officer, historic buildings. INSTRUCCIONES PARA EL CUIDADO EN EL HOGAR  Cumpla con todas las visitas de control, segn le indique su mdico. Podr necesitar exmenes peridicos para controlar si vuelven a aparecer.  SOLICITE ATENCIN MDICA SI:  Nota sangrado al mover el intestino.  Document Released: 01/08/2012 [facility] Patient Information 2013 [place], [place].

## 2012-06-10 NOTE — Progress Notes (Signed)
Patient ride is here left floor ambulatory with family.

## 2012-06-10 NOTE — Op Note (Signed)
[facility] [address] [place], [address]   COLONOSCOPY PROCEDURE REPORT  PATIENT: Jackie Abbott, Jackie Abbott  MR#: [id number] BIRTHDATE: Aug 17, 1975 , 36  yrs. old GENDER: Female ENDOSCOPIST: Rachael Fee, MD PROCEDURE DATE:  06/10/2012 PROCEDURE:   Colonoscopy with snare polypectomy ASA CLASS:   Class II INDICATIONS:rectal bleeding. MEDICATIONS: Fentanyl 75 mcg IV and Versed 6 mg IV  DESCRIPTION OF PROCEDURE:   After the risks benefits and alternatives of the procedure were thoroughly explained, informed consent was obtained.  A digital rectal exam revealed no abnormalities of the rectum.   The Pentax Colonoscope [id number] endoscope was introduced through the anus and advanced to the cecum, which was identified by both the appendix and ileocecal valve. No adverse events experienced.   The quality of the prep was good.  The instrument was then slowly withdrawn as the colon was fully examined.   COLON FINDINGS: There was a 5mm polyp on a long stalk in sigmoid colon.  This was removed with snare/cautery and sent to pathology. There was small to medium, non-thrombosed external hemorrhoids. The examination was otherwise normal.  Retroflexed views revealed no abnormalities. The time to cecum=3 minutes 00 seconds. Withdrawal time=10 minutes 00 seconds.  The scope was withdrawn and the procedure completed. COMPLICATIONS: There were no complications.  ENDOSCOPIC IMPRESSION: There was a 5mm polyp on a long stalk in sigmoid colon.  This was removed with snare/cautery and sent to pathology.  There was small to medium, non-thrombosed external hemorrhoids.  RECOMMENDATIONS: If the polyp(s) removed today are proven to be adenomatous (pre-cancerous) polyps, you will need a repeat colonoscopy in 5 years.  Otherwise you should continue to follow colorectal cancer screening guidelines for "routine risk" patients with colonoscopy in 10 years.  You will receive a letter  within 1-2 weeks with the results of your biopsy as well as final recommendations.  Please call my office if you have not received a letter after 3 weeks. Please use over the counter Preparation H for hemorrhoidal bleeding as needed. Heavy menstral bleeding needs evaluation, I will leave that to admitting team to arrange.   eSigned:  Rachael Fee, MD 06/10/2012 10:36 AM      PATIENT NAME:  [name], [name] MR#: [id number]

## 2012-06-10 NOTE — Discharge Summary (Signed)
INTERNAL MEDICINE TEACHING SERVICE Attending Note  Date: 06/10/2012  Patient name: Jackie Abbott  Medical record number: [id number]  Date of birth: 1975-11-12    This patient has been seen and discussed with the house staff. Please see their note for complete details. I concur with their findings. See my note on date of discharge.   Jonah Blue, DO  06/10/2012, 4:28 PM

## 2012-06-10 NOTE — Interval H&P Note (Signed)
History and Physical Interval Note:  06/10/2012 9:22 AM  Jackie Abbott  has presented today for surgery, with the diagnosis of Rectal bleeding  The various methods of treatment have been discussed with the patient and family. After consideration of risks, benefits and other options for treatment, the patient has consented to  Procedure(s) (LRB) with comments: COLONOSCOPY (N/A) as a surgical intervention .  The patient's history has been reviewed, patient examined, no change in status, stable for surgery.  I have reviewed the patient's chart and labs.  Questions were answered to the patient's satisfaction.     Rob Bunting

## 2012-06-10 NOTE — Progress Notes (Signed)
Subjective: She underwent colonoscopy today with no complications. She denies bloody BM since Sunday 7AM.  Objective: Vital signs in last 24 hours: Filed Vitals:   06/09/12 0432 06/09/12 1439 06/09/12 2147 06/10/12 0539  BP: 111/70 121/73 116/80 110/70  Pulse: 81 82 84 78  Temp: 98.4 F (36.9 C) 98.7 F (37.1 C) 98.8 F (37.1 C) 98.2 F (36.8 C)  TempSrc: Oral Oral Oral Oral  Resp: 18 18 16 16   Height:      Weight: 158 lb 1.1 oz (71.7 kg)   153 lb 7 oz (69.6 kg)  SpO2: 99% 100% 99% 97%   Weight change: 3 lb 1.4 oz (1.4 kg)  Intake/Output Summary (Last 24 hours) at 06/10/12 0801 Last data filed at 06/10/12 0542  Gross per 24 hour  Intake 3021.25 ml  Output      0 ml  Net 3021.25 ml   Vitals reviewed.  General: Facial pallor, resting in bed, in NAD  HEENT: no scleral icterus  Cardiac: RRR, II/VI SEM heard best at the LUSB, no gallops  Pulm: clear to auscultation bilaterally, no wheezes, rales, or rhonchi  Abd: soft, nontender, nondistended, BS present  Ext: warm and well perfused, no pedal edema  Neuro: alert and oriented X3, cranial nerves II-XII grossly intact, strength and sensation to light touch equal in bilateral upper and lower extremities  Skin: No rashes  Lab Results: Basic Metabolic Panel:  Lab 06/09/12 4098 06/08/12 1038  NA 139 135  K 3.8 4.0  CL 109 103  CO2 24 22  GLUCOSE 95 124*  BUN 11 12  CREATININE 0.63 0.51  CALCIUM 8.1* 8.8  MG -- --  PHOS -- --   Liver Function Tests:  Lab 06/08/12 1038 06/08/12 0939  AST 20 18  ALT 16 16  ALKPHOS 83 72  BILITOT 0.3 0.3  PROT 6.8 6.5  ALBUMIN 3.7 4.1   CBC:  Lab 06/10/12 0003 06/09/12 1628 06/08/12 2134 06/08/12 1627  WBC 4.6 5.0 -- --  NEUTROABS -- -- 1.2* 2.1  HGB 9.3* 8.4* -- --  HCT 27.4* 25.0* -- --  MCV 84.3 83.6 -- --  PLT 196 181 -- --   Cardiac Enzymes:  Lab 06/08/12 2135 06/08/12 1622  CKTOTAL -- --  CKMB -- --  CKMBINDEX -- --  TROPONINI <0.30 <0.30   CBG:  Lab 06/09/12  0820  GLUCAP 102*   Hemoglobin A1C:  Lab 06/08/12 1745  HGBA1C 5.9*   Coagulation:  Lab 06/08/12 1623  LABPROT 14.7  INR 1.17   Anemia Panel:  Lab 06/09/12 0838 06/09/12 0704  VITAMINB12 551 --  FOLATE -- --  FERRITIN 6* --  TIBC 364 --  IRON 41* --  RETICCTPCT -- 1.4   Urinalysis:  Lab 06/08/12 2108  COLORURINE YELLOW  LABSPEC 1.016  PHURINE 6.0  GLUCOSEU NEGATIVE  HGBUR NEGATIVE  BILIRUBINUR NEGATIVE  KETONESUR NEGATIVE  PROTEINUR NEGATIVE  UROBILINOGEN 0.2  NITRITE NEGATIVE  LEUKOCYTESUR NEGATIVE   Micro Results: Recent Results (from the past 240 hour(s))  URINE CULTURE     Status: Normal   Collection Time   06/08/12  9:08 PM      Component Value Range Status Comment   Specimen Description URINE, RANDOM   Final    Special Requests NONE   Final    Culture  Setup Time 06/08/2012 21:37   Final    Colony Count 10,000 COLONIES/ML   Final    Culture     Final  Value: GROUP B STREP(S.AGALACTIAE)ISOLATED     Note: TESTING AGAINST S. AGALACTIAE NOT ROUTINELY PERFORMED DUE TO PREDICTABILITY OF AMP/PEN/VAN SUSCEPTIBILITY.   Report Status 06/10/2012 FINAL   Final    Studies/Results: X-ray Chest Pa And Lateral   06/08/2012  *RADIOLOGY REPORT*  Clinical Data: GI bleeding.  CHEST - 2 VIEW  Comparison: No priors.  Findings: Lung volumes are normal.  No consolidative airspace disease.  No pleural effusions.  No pneumothorax.  No pulmonary nodule or mass noted.  Pulmonary vasculature and the cardiomediastinal silhouette are within normal limits.  IMPRESSION: 1. No radiographic evidence of acute cardiopulmonary disease.   Original Report Authenticated By: Trudie Reed, M.D.    Colonoscopy 06/10/12:  COLON FINDINGS: There was a 5mm polyp on a long stalk in sigmoid  colon. This was removed with snare/cautery and sent to pathology.  There was small to medium, non-thrombosed external hemorrhoids.  The examination was otherwise normal. Retroflexed views revealed  no  abnormalities. The time to cecum=3 minutes 00 seconds.  Withdrawal time=10 minutes 00 seconds. The scope was withdrawn and  the procedure completed. RECOMMENDATIONS:  If the polyp(s) removed today are proven to be adenomatous  (pre-cancerous) polyps, you will need a repeat colonoscopy in 5  years. Otherwise you should continue to follow colorectal cancer  screening guidelines for "routine risk" patients with colonoscopy  in 10 years. You will receive a letter within 1-2 weeks with the  results of your biopsy as well as final recommendations. Please  call my office if you have not received a letter after 3 weeks.  Please use over the counter Preparation H for hemorrhoidal bleeding  as needed.    Medications: I have reviewed the patient's current medications. Scheduled Meds:   . ciprofloxacin  400 mg Intravenous Q12H  . pantoprazole (PROTONIX) IV  40 mg Intravenous Q12H  . [COMPLETED] peg 3350 powder  0.5 kit Oral Once   Followed by  . [COMPLETED] peg 3350 powder  0.5 kit Oral Once  . sodium chloride  3 mL Intravenous Q12H  . [DISCONTINUED] peg 3350 powder  1 kit Oral Once   Continuous Infusions:   . sodium chloride 125 mL/hr at 06/10/12 0542  . sodium chloride 20 mL/hr (06/09/12 2007)   PRN Meds:.acetaminophen, acetaminophen, ondansetron (ZOFRAN) IV, ondansetron Assessment/Plan: 36 year old woman from [place], immigrated in 2001, with PMH of gestational diabetes, menorrhagia, and hemorrhoids, presenting with bloody diarrhea x 1day.   Hematochezia: Differential to include bleeding hemorrhoids (given patient's history of hemorrhoids and positive finding of internal hemorrhoids on physical exam ), bacterial infectious colitis with likely pathogen _Campylobacter jejuni_ (given the sudden onset, stable vitals, and resolution of her symptoms within 24hours),and AVM (albought the patient denies massive bleeding and her vitals are not consistent with massive acute blood loss), IBD (though  her symptoms are acute and she never had abdominal symptoms prior to this encounter), and diverticulitis (thought she is young with no history of diverticulosis and currently no LLQ pain) . Per Dr. Allyson Sabal, in GI, this was most likely bacterial infectious colitis and the patient will likely need supportive treatment. She is afebrile at this time with no tachycardia, and no leukocytosis. Her hemoglobin dropped to 7 yesterday morning but was back up to 9.3 in the evening; it is 8.7 today. She had colonoscopy today that showed a 5mm polyp on a long stalk in her sigmoid colon that was removed with snare/cautery and sent to pathology.  -CBC with diff q 8hours  -CMET in  AM  -D/cd Cipro IV no evidence of bacterial infective colitis at this time  -Continue to monitor for recurrent bleeding  -Stool O&P, fecal Gram Stain and culture  -UA unremarkable.  -blood culture if patient becomes febrile    Asymptomatic bacteriuria. She denies dysuria, her UA was unremarkable but her urine culture grew 10,000 colonies of Group B strep.   Tenesmus: This is likely secondary to her external hemorrhoid but tumor/mass cannot be excluded at this time. GI has been consulted, will follow recommendations.  -Preparation H as needed for bleeding hemorrhoids  Anemia: Unclear if acute or if due to menorrhagia x 3 years. She currently has no tachycardic and denies shortness of breath or dizziness. Her hemoglobin has dropped from 8.5-->7.9-->7.5 yesterday morning; however, WBC is also suppressed, this could be a dilutional component. Her hemoglobin increased overnight to 9.3. Her anemia panel is consistent with iron deficiency anemia, likely secondary to her menorrhagia for the past 3 years. She will need GYN follow up as outpatient.  -Ferrous sulfate tablets 325mg  TID with meals.  -Colace and Miralax for bowel prophylaxis.  - CBC q 8h  - Will transfuse if Hg<7 and patient is symptomatic   Hyperglycemia. Patient had hyperglycemia on  admission with serum glucose of 124 in the context of decreased PO for one day. She had gestational diabetes but never had regular follow up after the birth of her last child in 2010. Her HgbA1C is 5.9 (prediabetes range).   Dispo: Disposition is deferred at this time, awaiting improvement of current medical problems. Anticipated discharge is today.  The patient does not have current PCP , therefore will require OPC follow-up after discharge.  The patient does not have transportation limitations that hinder transportation to clinic appointments.     LOS: 2 days   Ky Barban 06/10/2012, 8:01 AM

## 2012-06-10 NOTE — Progress Notes (Signed)
INTERNAL MEDICINE TEACHING SERVICE Attending Note  Date: 06/10/2012  Patient name: Jackie Abbott  Medical record number: [id number]  Date of birth: 11-Apr-1976    This patient has been seen and discussed with the house staff. Please see their note for complete details. I concur with their findings with the following additions/corrections: Feels well this afternoon. S/p colonoscopy with findings of one polyp and external hemorrhoids. No further bleeding reported by patient. She denies any dizziness, nausea, vomiting. She denies any abdominal pain. She admits she has heavy menses, but states this was all rectal bleeding. She is noted to have iron deficiency anemia, likely at baseline Hgb at this time.  A/P: 36 yr. Old hispanic female with pmhx significant for gestational DM, menorrhagia, presented due to a two day history of bloody bowel movements.  1) Hematochezia: No obvious source of bleeding on colonoscopy. I do not think this was an upper GI bleed. There has been no further bleeding. No evidence of diverticuli on colonoscopy, but differential included diverticular bleed. Stool culture is pending and so is stool cdiff, though at this time I doubt either of these two. I do not see a need to continue abx therapy at this time.  Her H/H is stable. I advised her to return to the ED immediately if this reoccurs. She has iron deficiency anemia likely from menorrhagia. Start FeSO4 325 mg po tid with colace OTC daily. She will need follow up in our clinic (she states she has no PCP) in the next four weeks for workup of her menorrhagia. This will include TSH and possibly pelvic U/S. She had a negative pregnancy test.  D/C home and establish with PCP in the next 4 weeks. I answered all of her questions and informed her to return to ED if rectal bleeding reoccurs. 2) Iron deficiency anemia: As above. No current indication for transfusion. She is asymptomatic.    Jonah Blue, DO  06/10/2012, 2:35 PM

## 2012-06-10 NOTE — Discharge Summary (Signed)
Internal Medicine Teaching [facility] Discharge Note (Patient was admitted as unassigned but is followed by Family Medicine)  Name: Jackie Abbott MRN: [id number] DOB: [birth year] 36 y.o.  Date of Admission: 06/08/2012 11:13 AM Date of Discharge: 06/10/2012 Attending Physician: Jonah Blue, DO  Discharge Diagnosis: Principal Problem:  *Blood per rectum Active Problems:  Hyperglycemia  Menorrhagia  Anemia  Hemorrhoids  Benign neoplasm of colon  Bacteriuria, asymptomatic   Discharge Medications:   Medication List     As of 06/10/2012  4:11 PM    TAKE these medications         DSS 100 MG Caps   Take 100 mg by mouth 2 (two) times daily.      ferrous sulfate 325 (65 FE) MG tablet   Take 1 tablet (325 mg total) by mouth 3 (three) times daily with meals.      hydrocortisone cream 1 %   Apply topically as needed.      polyethylene glycol packet   Commonly known as: MIRALAX / GLYCOLAX   Take 17 g by mouth daily as needed.          Disposition and follow-up:   Jackie Abbott was discharged from [facility] in good condition.  At the hospital follow up visit please address her therapy with iron supplementation and recheck her CBC. She will need follow up with GYN    Follow-up Appointments: Follow-up Information    Follow up with Kevin Fenton, MD.   Contact information:   [address] [place] [address] [phone]    Interpreter will be avaiable     Discharge Orders    Future Appointments: Provider: Department: Dept Phone: Center:   06/23/2012 11:00 AM Elenora Gamma, MD [facility] [phone] [facility]     Future Orders Please Complete By Expires   Diet - low sodium heart healthy      Increase activity slowly         Consultations: Dr. Christella Hartigan, Gastroenterology  Procedures Performed:  X-ray Chest Pa And Lateral   06/08/2012  *RADIOLOGY REPORT*  Clinical Data: GI bleeding.  CHEST - 2  VIEW  Comparison: No priors.  Findings: Lung volumes are normal.  No consolidative airspace disease.  No pleural effusions.  No pneumothorax.  No pulmonary nodule or mass noted.  Pulmonary vasculature and the cardiomediastinal silhouette are within normal limits.  IMPRESSION: 1. No radiographic evidence of acute cardiopulmonary disease.   Original Report Authenticated By: Trudie Reed, M.D.    Colonoscopy: 06/10/12: COLON FINDINGS: There was a 5mm polyp on a long stalk in sigmoid  colon. This was removed with snare/cautery and sent to pathology.  There was small to medium, non-thrombosed external hemorrhoids.  The examination was otherwise normal. Retroflexed views revealed  no abnormalities. The time to cecum=3 minutes 00 seconds.  Withdrawal time=10 minutes 00 seconds. The scope was withdrawn and  the procedure completed.  RECOMMENDATIONS:  If the polyp(s) removed today are proven to be adenomatous  (pre-cancerous) polyps, you will need a repeat colonoscopy in 5  years. Otherwise you should continue to follow colorectal cancer  screening guidelines for "routine risk" patients with colonoscopy  in 10 years. You will receive a letter within 1-2 weeks with the  results of your biopsy as well as final recommendations. Please  call my office if you have not received a letter after 3 weeks.  Please use over the counter Preparation H for hemorrhoidal bleeding  as needed.  Admission HPI:  Jackie Abbott is a 36 year old woman immigrant from [place] with PMH significant for gestational diabetes (not requiring insulin, in 2010), and menorragia who [facility]. She had been constipated for two days until yesterday around 3PM when she had the urge to go to the bathroom and had a bloody BM with solid stool and bright red blood filling the toilet. She reports having several episodes of bloody diarrhea since then, approximately 20. This morning  she had nausea and one small amount of non-bloody emesis. She reports that her BM stopped around 7AM today.  Ten years ago she had similar symptoms and was told she had hemorrhoids. She does not recall having bloody bowel movements every since.  She denies recent travel outside the [place], recent antibiotics use. She takes Ibuprofen occasionally for headaches with less intake two weeks ago. She does not take aspirin but drinks 2-3 cups of coffee per day and drinks green tea with ginseng. Normally she goes to the the urgent care if she needs medical attention, she does not have a PCP. She denies sick contacts. She states that normally she is not constipated. Her LMP was on 05/23/12.  She denies SOB, fever/chills, headache, dizziness, or chest pain.  In the ED, her Hbg is 9.7 but there is no CBC on file since 2010, when she was pregnant of her third child. Her vitals are stable, with no tachycardia. Gi was consulted.    Hospital Course by problem list: Hematochezia: Etiology unclear but likely secondary to bleeding external hemorrhoid. The differential included bleeding hemorrhoids (given patient's history of hemorrhoids and positive finding of internal hemorrhoids on physical exam ), bacterial infectious colitis with likely pathogen _Campylobacter jejuni_ (given the sudden onset, stable vitals, and resolution of her symptoms within 24hours),and AVM (albought the patient denies massive bleeding and her vitals are not consistent with massive acute blood loss), IBD (though her symptoms are acute and she never had abdominal symptoms prior to this encounter), and diverticulitis (thought she is young with no history of diverticulosis and currently no LLQ pain). She was placed on Cipro IV for empiric treatment of bacterial infectious colitis and was made NPO for bowel rest. She remained afebrile throughout this hospitalization, with no tachycardia, and no leukocytosis. We ordered stool O&P, fecal Gram Stain and culture  but a stool sample has just been collected prior to her discharge. Her hemoglobin dropped to 7 on 11/18 morning but was back up to 9.3 in the evening; it is 8.7 today. She had colonoscopy today that showed a 5mm polyp on a long stalk in her sigmoid colon that was removed with snare/cautery and sent to pathology. She denies hematochezia since 11/17 at 7AM. She will be discharged home with instruction to return to the ED if she has bloody BM again. She will follow up with her PCP, Dr. Ermalinda Memos in [facility].   Asymptomatic bacteriuria. She denies dysuria, her UA was unremarkable but her urine culture grew 10,000 colonies of Group B strep. We will not treat at this time as she is asymptomatic. She will need to follow up with her PCP if she becomes symptomatic.   Tenesmus: This is likely secondary to her external hemorrhoid but tumor/mass cannot be excluded at this time. GI has been consulted, will follow recommendations. Preparation H as needed for bleeding hemorrhoids.    Anemia: Unclear if acute or if due to menorrhagia x 3 years. She  currently has no tachycardic and denies shortness of breath or dizziness. Her hemoglobin has dropped from 8.5-->7.9-->7.5 yesterday morning; however, WBC is also suppressed, this could be a dilutional component. Her hemoglobin increased overnight to 9.3. Her anemia panel is consistent with iron deficiency anemia, likely secondary to her menorrhagia for the past 3 years. She will need GYN follow up as outpatient. She will be discharged with Ferrous sulfate tablets 325mg  TID with meals and Colace and Miralax for bowel prophylaxis. She will need repeat CBC at her outpatient follow up visit.    Hyperglycemia. Patient had hyperglycemia on admission with serum glucose of 124 in the context of decreased PO for one day. She had gestational diabetes but never had regular follow up after the birth of her last child in 2010. Her HgbA1C is 5.9 (prediabetes range). She will need further  counseling on her diet and exercise regimen.   Discharge Vitals:  BP 107/62  Pulse 83  Temp 98.7 F (37.1 C) (Oral)  Resp 19  Ht 5' (1.524 m)  Wt 153 lb 7 oz (69.6 kg)  BMI 29.97 kg/m2  SpO2 100%  LMP 05/23/2012  Discharge Labs:  Results for orders placed during the hospital encounter of 06/08/12 (from the past 24 hour(s))  CBC     Status: Abnormal   Collection Time   06/09/12  4:28 PM      Component Value Range   WBC 5.0  4.0 - 10.5 K/uL   RBC 2.99 (*) 3.87 - 5.11 MIL/uL   Hemoglobin 8.4 (*) 12.0 - 15.0 g/dL   HCT 40.9 (*) 81.1 - 91.4 %   MCV 83.6  78.0 - 100.0 fL   MCH 28.1  26.0 - 34.0 pg   MCHC 33.6  30.0 - 36.0 g/dL   RDW 78.2  95.6 - 21.3 %   Platelets 181  150 - 400 K/uL  HIV ANTIBODY (ROUTINE TESTING)     Status: Normal   Collection Time   06/09/12  4:28 PM      Component Value Range   HIV NON REACTIVE  NON REACTIVE  CBC     Status: Abnormal   Collection Time   06/10/12 12:03 AM      Component Value Range   WBC 4.6  4.0 - 10.5 K/uL   RBC 3.25 (*) 3.87 - 5.11 MIL/uL   Hemoglobin 9.3 (*) 12.0 - 15.0 g/dL   HCT 08.6 (*) 57.8 - 46.9 %   MCV 84.3  78.0 - 100.0 fL   MCH 28.6  26.0 - 34.0 pg   MCHC 33.9  30.0 - 36.0 g/dL   RDW 62.9  52.8 - 41.3 %   Platelets 196  150 - 400 K/uL  GLUCOSE, CAPILLARY     Status: Abnormal   Collection Time   06/10/12  8:02 AM      Component Value Range   Glucose-Capillary 102 (*) 70 - 99 mg/dL  CBC     Status: Abnormal   Collection Time   06/10/12  8:39 AM      Component Value Range   WBC 3.6 (*) 4.0 - 10.5 K/uL   RBC 3.09 (*) 3.87 - 5.11 MIL/uL   Hemoglobin 8.7 (*) 12.0 - 15.0 g/dL   HCT 24.4 (*) 01.0 - 27.2 %   MCV 84.5  78.0 - 100.0 fL   MCH 28.2  26.0 - 34.0 pg   MCHC 33.3  30.0 - 36.0 g/dL   RDW 53.6  64.4 - 03.4 %  Platelets 186  150 - 400 K/uL  HEPATIC FUNCTION PANEL     Status: Normal   Collection Time   06/10/12  8:39 AM      Component Value Range   Total Protein 6.9  6.0 - 8.3 g/dL   Albumin 3.6  3.5 - 5.2  g/dL   AST 23  0 - 37 U/L   ALT 15  0 - 35 U/L   Alkaline Phosphatase 75  39 - 117 U/L   Total Bilirubin 0.3  0.3 - 1.2 mg/dL   Bilirubin, Direct <5.2  0.0 - 0.3 mg/dL   Indirect Bilirubin NOT CALCULATED  0.3 - 0.9 mg/dL  CBC     Status: Abnormal   Collection Time   06/10/12  3:01 PM      Component Value Range   WBC 4.3  4.0 - 10.5 K/uL   RBC 2.92 (*) 3.87 - 5.11 MIL/uL   Hemoglobin 8.1 (*) 12.0 - 15.0 g/dL   HCT 84.1 (*) 32.4 - 40.1 %   MCV 83.6  78.0 - 100.0 fL   MCH 27.7  26.0 - 34.0 pg   MCHC 33.2  30.0 - 36.0 g/dL   RDW 02.7  25.3 - 66.4 %   Platelets 185  150 - 400 K/uL    Signed: Sara Chu D 06/10/2012, 4:11 PM   Time Spent on Discharge: 40 minutes Services Ordered on Discharge: None Equipment Ordered on Discharge: None

## 2012-06-11 ENCOUNTER — Encounter (HOSPITAL_COMMUNITY): Payer: Self-pay

## 2012-06-11 ENCOUNTER — Encounter (HOSPITAL_COMMUNITY): Payer: Self-pay | Admitting: Gastroenterology

## 2012-06-11 LAB — TSH: TSH: 2.527 u[IU]/mL (ref 0.350–4.500)

## 2012-06-23 ENCOUNTER — Inpatient Hospital Stay: Payer: BC Managed Care – PPO | Admitting: Family Medicine

## 2012-07-04 ENCOUNTER — Inpatient Hospital Stay: Payer: BC Managed Care – PPO | Admitting: Family Medicine

## 2013-04-23 ENCOUNTER — Ambulatory Visit: Payer: BC Managed Care – PPO | Attending: Internal Medicine | Admitting: Internal Medicine

## 2013-04-23 VITALS — BP 108/72 | HR 66 | Temp 98.7°F | Resp 16 | Ht 60.43 in | Wt 154.0 lb

## 2013-04-23 DIAGNOSIS — E785 Hyperlipidemia, unspecified: Secondary | ICD-10-CM | POA: Insufficient documentation

## 2013-04-23 NOTE — Progress Notes (Signed)
Patient ID: Jackie Abbott, female   DOB: 1975-12-21, 37 y.o.   MRN: [id number]  Patient Demographics  Jackie Abbott, is a 37 y.o. female  CSN: [id number]  MRN: [id number]  DOB - [birth year]  Outpatient Primary MD for the patient is Kevin Fenton, MD   With History of -  Past Medical History  Diagnosis Date  . Gestational diabetes       Past Surgical History  Procedure Laterality Date  . Colonoscopy  06/10/2012    Procedure: COLONOSCOPY;  Surgeon: Rachael Fee, MD;  Location: [facility] ENDOSCOPY;  Service: Endoscopy;  Laterality: N/A;    in for   Chief Complaint  Patient presents with  . Establish Care     HPI  [name]  is a 37 y.o. female, with no previous health problems except just a short diabetes comes to the clinic to establish care, she recently went to a health fair where she was told her cholesterol was slightly high and is eager to get it checked. She is already eaten breakfast and lunch today. She has no subjective complaints. No fever chills, no headache, no chest pain palpitations cough shortness of breath, no diarrhea no blood in stool or urine no dysuria, no joint pains or a skin rashes or bruises. No focal weakness.    Review of Systems    In addition to the HPI above,  No Fever-chills, No Headache, No changes with Vision or hearing, No problems swallowing food or Liquids, No Chest pain, Cough or Shortness of Breath, No Abdominal pain, No Nausea or Vommitting, Bowel movements are regular, No Blood in stool or Urine, No dysuria, No new skin rashes or bruises, No new joints pains-aches,  No new weakness, tingling, numbness in any extremity, No recent weight gain or loss, No polyuria, polydypsia or polyphagia, No significant Mental Stressors.  A full 10 point Review of Systems was done, except as stated above, all other Review of Systems were negative.   Social History History  Substance Use Topics  . Smoking status: Never Smoker   .  Smokeless tobacco: Not on file  . Alcohol Use: No     Family History Family History  Problem Relation Age of Onset  . Diabetes Father   . Hypertension Sister      Prior to Admission medications   Medication Sig Start Date End Date Taking? Authorizing Provider  docusate sodium 100 MG CAPS Take 100 mg by mouth 2 (two) times daily. 06/10/12   Ky Barban, MD  ferrous sulfate 325 (65 FE) MG tablet Take 1 tablet (325 mg total) by mouth 3 (three) times daily with meals. 06/10/12   Ky Barban, MD  hydrocortisone cream 1 % Apply topically as needed. 06/10/12   Ky Barban, MD  polyethylene glycol (MIRALAX / GLYCOLAX) packet Take 17 g by mouth daily as needed. 06/10/12   Ky Barban, MD    No Known Allergies  Physical Exam  Vitals  Blood pressure 108/72, pulse 66, temperature 98.7 F (37.1 C), temperature source Oral, resp. rate 16, height 5' 0.43" (1.535 m), weight 154 lb (69.854 kg), last menstrual period 04/04/2013, SpO2 100.00%.   1. General Hispanic female middle-aged sitting on clinic examination table in no apparent distress,    2. Normal affect and insight, Not Suicidal or Homicidal, Awake Alert, Oriented X 3.  3. No F.N deficits, ALL C.Nerves Intact, Strength 5/5 all 4 extremities, Sensation intact all 4 extremities, Plantars down going.  4. Ears and  Eyes appear Normal, Conjunctivae clear, PERRLA. Moist Oral Mucosa.  5. Supple Neck, No JVD, No cervical lymphadenopathy appriciated, No Carotid Bruits.  6. Symmetrical Chest wall movement, Good air movement bilaterally, CTAB.  7. RRR, No Gallops, Rubs or Murmurs, No Parasternal Heave.  8. Positive Bowel Sounds, Abdomen Soft, Non tender, No organomegaly appriciated,No rebound -guarding or rigidity.  9.  No Cyanosis, Normal Skin Turgor, No Skin Rash or Bruise.  10. Good muscle tone,  joints appear normal , no effusions, Normal ROM.  11. No Palpable Lymph Nodes in Neck or  Axillae    Data Review  CBC No results found for this basename: WBC, HGB, HCT, PLT, MCV, MCH, MCHC, RDW, NEUTRABS, LYMPHSABS, MONOABS, EOSABS, BASOSABS, BANDABS, BANDSABD,  in the last 168 hours ------------------------------------------------------------------------------------------------------------------  Chemistries  No results found for this basename: NA, K, CL, CO2, GLUCOSE, BUN, CREATININE, GFRCGP, CALCIUM, MG, AST, ALT, ALKPHOS, BILITOT,  in the last 168 hours ------------------------------------------------------------------------------------------------------------------ estimated creatinine clearance is 83.3 ml/min (by C-G formula based on Cr of 0.63). ------------------------------------------------------------------------------------------------------------------ No results found for this basename: TSH, T4TOTAL, FREET3, T3FREE, THYROIDAB,  in the last 72 hours   Coagulation profile No results found for this basename: INR, PROTIME,  in the last 168 hours ------------------------------------------------------------------------------------------------------------------- No results found for this basename: DDIMER,  in the last 72 hours -------------------------------------------------------------------------------------------------------------------  Cardiac Enzymes No results found for this basename: CK, CKMB, TROPONINI, MYOGLOBIN,  in the last 168 hours ------------------------------------------------------------------------------------------------------------------ No components found with this basename: POCBNP,    ---------------------------------------------------------------------------------------------------------------  Urinalysis    Component Value Date/Time   COLORURINE YELLOW 06/08/2012 2108   APPEARANCEUR CLEAR 06/08/2012 2108   LABSPEC 1.016 06/08/2012 2108   PHURINE 6.0 06/08/2012 2108   GLUCOSEU NEGATIVE 06/08/2012 2108   HGBUR NEGATIVE 06/08/2012  2108   HGBUR negative 10/04/2008 0850   BILIRUBINUR NEGATIVE 06/08/2012 2108   KETONESUR NEGATIVE 06/08/2012 2108   PROTEINUR NEGATIVE 06/08/2012 2108   UROBILINOGEN 0.2 06/08/2012 2108   NITRITE NEGATIVE 06/08/2012 2108   LEUKOCYTESUR NEGATIVE 06/08/2012 2108       Assessment and plan  1. Dyslipidemia was found on a random blood check at a health fare - will get her back tomorrow fasting to check a fasting lipid panel tomorrow morning, routine blood work will also be done for screening  at that time   Routine health maintenance.  Screening labs. CBC, CMP, TSH, A1c, lipid panel ordered for tomorrow morning fasting.   Patient has refused flu and tetanus shot   She sees her OB regularly for annual Pap smears last was January of this year and it was unremarkable.   Leroy Sea M.D on 04/23/2013 at 3:39 PM

## 2013-04-23 NOTE — Progress Notes (Signed)
Pt is here to est care Reports she was referred here by a health fair due to high chol Pt not fasting... Alert w/no signs of acute distress.

## 2013-04-24 ENCOUNTER — Ambulatory Visit: Payer: BC Managed Care – PPO | Attending: Internal Medicine

## 2013-04-24 VITALS — BP 103/69 | HR 66 | Temp 98.5°F | Resp 14

## 2013-04-24 DIAGNOSIS — E785 Hyperlipidemia, unspecified: Secondary | ICD-10-CM

## 2013-04-24 LAB — COMPLETE METABOLIC PANEL WITH GFR
ALT: 20 U/L (ref 0–35)
AST: 21 U/L (ref 0–37)
Alkaline Phosphatase: 70 U/L (ref 39–117)
Creat: 0.65 mg/dL (ref 0.50–1.10)
Total Bilirubin: 0.7 mg/dL (ref 0.3–1.2)

## 2013-04-24 LAB — LIPID PANEL
Cholesterol: 201 mg/dL — ABNORMAL HIGH (ref 0–200)
Total CHOL/HDL Ratio: 5.6 Ratio
VLDL: 28 mg/dL (ref 0–40)

## 2013-04-24 LAB — CBC
HCT: 37.2 % (ref 36.0–46.0)
Hemoglobin: 12.8 g/dL (ref 12.0–15.0)
MCH: 30.1 pg (ref 26.0–34.0)
MCV: 87.5 fL (ref 78.0–100.0)
RBC: 4.25 MIL/uL (ref 3.87–5.11)

## 2013-04-24 LAB — TSH: TSH: 2.866 u[IU]/mL (ref 0.350–4.500)

## 2013-04-25 LAB — HEMOGLOBIN A1C
Hgb A1c MFr Bld: 5.5 % (ref <5.7)
Mean Plasma Glucose: 111 mg/dL (ref <117)

## 2013-04-25 NOTE — Progress Notes (Signed)
Quick Note:  Please inform patient that labs came back OK. Cholesterol was only slightly elevated. Recommend low fat low cholesterol diet and exercise 5x per week by walking for 30 mins. Recheck labs in 4-6 months.   Rodney Langton, MD, CDE, FAAFP [facility] [place], [place]   ______

## 2013-04-27 ENCOUNTER — Telehealth: Payer: Self-pay | Admitting: Emergency Medicine

## 2013-04-27 NOTE — Telephone Encounter (Signed)
Message copied by Darlis Loan on Mon Apr 27, 2013  3:51 PM ------      Message from: Cleora Fleet      Created: Sat Apr 25, 2013  7:34 AM       Please inform patient that labs came back OK.  Cholesterol was only slightly elevated.  Recommend low fat low cholesterol diet and exercise 5x per week by walking for 30 mins.  Recheck labs in 4-6 months.             Rodney Langton, MD, CDE, FAAFP      [facility]      [facility]      [place], [place]        ------

## 2013-04-27 NOTE — Telephone Encounter (Signed)
Message copied by Darlis Loan on Mon Apr 27, 2013 12:14 PM ------      Message from: [facility] [place] [facility]      Created: Mon Apr 27, 2013  9:12 AM       Mild dyslipidemia, patient should go on a heart healthy diet, 30 minutes of activity 5-6 times a week. She will need a repeat lipid panel along with a repeat CBC in 6-8 weeks' time. ------

## 2013-04-27 NOTE — Telephone Encounter (Signed)
Please call pt with negative lab results

## 2013-04-27 NOTE — Telephone Encounter (Signed)
Left message for pt to call clinic for f/u blood work in 6-8 weeks

## 2013-04-27 NOTE — Progress Notes (Signed)
Quick Note:  Mild dyslipidemia, patient should go on a heart healthy diet, 30 minutes of activity 5-6 times a week. She will need a repeat lipid panel along with a repeat CBC in 6-8 weeks' time. ______

## 2013-04-28 NOTE — Telephone Encounter (Signed)
Patient aware of her results 

## 2013-05-18 ENCOUNTER — Ambulatory Visit: Payer: BC Managed Care – PPO

## 2014-08-23 ENCOUNTER — Other Ambulatory Visit (HOSPITAL_COMMUNITY): Payer: Self-pay | Admitting: *Deleted

## 2014-08-23 DIAGNOSIS — N644 Mastodynia: Secondary | ICD-10-CM

## 2014-09-09 ENCOUNTER — Ambulatory Visit: Payer: Self-pay

## 2014-09-09 ENCOUNTER — Other Ambulatory Visit: Payer: Self-pay | Admitting: Obstetrics and Gynecology

## 2014-09-09 ENCOUNTER — Ambulatory Visit (HOSPITAL_COMMUNITY)
Admission: RE | Admit: 2014-09-09 | Discharge: 2014-09-09 | Disposition: A | Discharge status: Home / Self Care | Payer: Self-pay | Source: Ambulatory Visit | Attending: Obstetrics and Gynecology | Admitting: Obstetrics and Gynecology

## 2014-09-09 ENCOUNTER — Encounter (HOSPITAL_COMMUNITY): Payer: Self-pay

## 2014-09-09 VITALS — BP 108/64 | Temp 98.2°F | Ht 61.0 in | Wt 160.6 lb

## 2014-09-09 DIAGNOSIS — Z1231 Encounter for screening mammogram for malignant neoplasm of breast: Secondary | ICD-10-CM

## 2014-09-09 DIAGNOSIS — Z1239 Encounter for other screening for malignant neoplasm of breast: Secondary | ICD-10-CM

## 2014-09-09 NOTE — Patient Instructions (Signed)
Educational materials on self breast awareness given to patient. Explained to Jackie Abbott that she did not need a Pap smear today due to last Pap smear was in January 2014 per patient. Let her know [facility]CP will cover Pap smears every 3 years unless has a history of abnormal Pap smears. Let patient know the [facility] will follow up with her within the next couple weeks with results by letter or phone. Jackie Abbott verbalized understanding. Patient escorted to mammography for a screening mammogram.  Kaesha Kirsch, [name], RN 1:11 PM

## 2014-09-09 NOTE — Progress Notes (Signed)
No complaints today.  Pap Smear:  Pap smear not completed today. Last Pap smear was in January 2014 by Dr. Ouida Sills at [facility] and normal per patient. Per patient has no history of an abnormal Pap smear. Last Pap smear result is not in EPIC but previous one on 06/15/2008 is in EPIC.  Physical exam: Breasts Right breast larger than left breast that is normal per patient. No skin abnormalities bilateral breasts. No nipple retraction bilateral breasts. No nipple discharge bilateral breasts. No lymphadenopathy. No lumps palpated bilateral breasts. No complaints of pain or tenderness on exam. Patient escorted to mammography for a screening mammogram.        Pelvic/Bimanual No Pap smear completed today since last Pap smear was in January 2014 per patient. Pap smear not indicated per [facility]CP guidelines.

## 2014-10-21 ENCOUNTER — Other Ambulatory Visit: Payer: Self-pay

## 2014-10-21 ENCOUNTER — Ambulatory Visit (HOSPITAL_BASED_OUTPATIENT_CLINIC_OR_DEPARTMENT_OTHER): Payer: Self-pay

## 2014-10-21 VITALS — BP 130/86 | HR 76 | Temp 98.3°F | Resp 16 | Ht 60.5 in | Wt 159.6 lb

## 2014-10-21 DIAGNOSIS — Z0000 Encounter for general adult medical examination without abnormal findings: Secondary | ICD-10-CM

## 2014-10-21 LAB — LIPID PANEL
CHOL/HDL RATIO: 5.4 ratio
CHOLESTEROL: 196 mg/dL (ref 0–200)
HDL: 36 mg/dL — ABNORMAL LOW (ref >=46)
LDL Cholesterol: 113 mg/dL — ABNORMAL HIGH (ref 0–99)
Triglycerides: 234 mg/dL — ABNORMAL HIGH (ref <150)
VLDL: 47 mg/dL — ABNORMAL HIGH (ref 0–40)

## 2014-10-21 LAB — GLUCOSE (CC13): Glucose: 97 mg/dl (ref 70–140)

## 2014-10-21 LAB — HEMOGLOBIN A1C
Hgb A1c MFr Bld: 5.8 % — ABNORMAL HIGH (ref <5.7)
Mean Plasma Glucose: 120 mg/dL — ABNORMAL HIGH (ref <117)

## 2014-10-21 NOTE — Progress Notes (Signed)
Patient is a new patient to the [facility] program and is currently a BCCCP patient effective 09/09/2014. Patient is accompanied by Zenda Alpers Hispanic interpreter.  Clinical Measurements: Patient is 5 ft. 1/3 inch, weight 159.6 lbs, waist circumference 35.5 inches, and hip circumference 39.inches.   Medical History: Patient has history of high cholesterol and was told to change her diet. Patient does not have a history of hypertension or diabetes. Patient stated that did have gestational diabetes with pregnancies. Per patient no diagnosed history of coronary heart disease, heart attack, heart failure, stroke/TIA, vascular disease or congenital heart defects.   Blood Pressure, Self-measurement: Patient states has no reason to check Blood pressure.  Nutrition Assessment: Patient stated that eats 1 to 2 fruits every day. Patient states she eats one to 2 servings of vegetables a day. Per patient states eat 3 or more ounces of whole grains daily. Patient doesn't eat two or more servings of fish weekly.Patient states she does not drink more than 36 ounces or 450 calories of beverages with added sugars weekly. Patient states she stopped drinking soda's a week ago and  Now drinks a lot of water. Patient stated she does not watch her salt intake. She states she has  a lot of salt on food.Discussed modifiable risk factors for high blood pressure   And patient received pamphlet on things to change and prevent high blood pressure.   Physical Activity Assessment: Patient stated she does not do much exercise. Per patient cleans house once a week for around 240 minutes or does no vigorous exercise.  Smoking Status: Patient has never smoked ans is not around smoke.  Quality of Life Assessment: In assessing patient's quality of life she stated that out of the past 30 days that she has felt her health was not  good for 6 days. Patient also stated that in the past 30 days that her mental health was good including  stress, depression and problems with emotions for all days. Patient did state that out of the past 30 days she felt her physical or mental health had not kept her from doing her usual activities including self-care, work or recreation.   Plan: Lab work will be done today including a lipid panel, blood glucose, and Hgb A1C. Will call lab results when they are finished. Patient will returned on February12th for Health Coaching and BP check. Will work on behavior modifications that we discussed to lower BP.

## 2014-10-21 NOTE — Patient Instructions (Signed)
Discussed health assessment with patient. She will be called with results of lab work and we will then discussed any further follow up the patient needs. Patient will implement behavior modifications to help lower BP. Patient verbalized understanding.

## 2014-10-22 ENCOUNTER — Telehealth: Payer: Self-pay

## 2014-10-22 ENCOUNTER — Ambulatory Visit: Payer: Self-pay

## 2014-10-22 NOTE — Telephone Encounter (Signed)
Called to inform about lab work from 10/21/14. Interpreter Lavon Paganini informed patient: cholesterol- 196, HDL- 36, LDL- 113, triglycerides - 234, Bld Glucose -97 and HBG-A1C - 5.8.  Set up health Coaching for April 7 at 11:15 AM at [facility] center for nutrition and activity.

## 2014-10-28 ENCOUNTER — Ambulatory Visit: Payer: Self-pay

## 2014-10-28 DIAGNOSIS — Z789 Other specified health status: Secondary | ICD-10-CM

## 2014-10-28 NOTE — Progress Notes (Signed)
Patient returns today for Health Coaching regarding Nutrition for her borderline AIC, HDL's,LDL's, Triglycerides and activity with no interpreter.   NUTRITION: Patient and I went over [place] Numbers again.Patient reviewed A1C handout to see about prediabetes, normal and  diabetes range. Patient went over what prediabetes is, complications that can result if do not get levels to normal, and stay at diabetes level. Discussed that needed to lose at least 10% of body weight (18 lbs).Discussed increasing fiber in diet, reading labels, increasing activity and serving sizes.Explained to patient and showed her that she is overweight and has BMI of 30.7.Marland Kitchen  Discussed watching carbohydrates, how to count them, serving sizes and number of carbs per day allowance. Used plastic measuring cup to see portion sizes .Patient reviewed weight /activity chart. Patient went over 1,200 cal Meal plan and we broke it down to the number of servings she could have per day. Patient received and reviewed the following handouts in Spanish: Carb counting Menu, prediabetes, A1C Exam, Your Game Plan to Prevent Type 2 Diabetes, 1200 calorie meal plan, Serving Portions, and [facility] Plan. Gave patient measuring cup to measure serving sizes. Also, received cookbook, water bottle, and pill box.  ACTIVITY: Discussed activity and walking Patient received pedometer and was explained and shown how it works. Told to try and walk 3 to 5 days a week for 7,500 to 10,000 steps Received and reviewed [facility] Activity book in [place] and [employer].  PLAN:. Increasing amount of walking per day and add other exercises to plan, Decrease carbohydrates in diet. Lose weight. Will return in 3 to 6 months to check A1C. Call if has problems.

## 2014-10-28 NOTE — Patient Instructions (Signed)
Patient will follow 1200 calorie diet plan. Will review all handouts and exercise/activity book. Will increase exercise and use pedometer. Will measure portion sizes. Will call if has any questions. Will call patient in three weeks. Patient verbalized understanding.

## 2015-02-11 ENCOUNTER — Telehealth: Payer: Self-pay

## 2015-02-11 NOTE — Telephone Encounter (Signed)
Left message that called and would call again

## 2015-05-06 ENCOUNTER — Telehealth: Payer: Self-pay

## 2015-05-06 NOTE — Telephone Encounter (Signed)
Called per Lavon Paganini (Hispanic Interpreter) for [facility] Program regarding Health Coaching. Patient stated that was good. Per patient is  watching her carbohydrates, sweets, and not drinking sodas.. Stated that had lost some weight. Patient stated that was eating more fruit and walking. Will call for health coaching in one month and plan on re screening in April 2017 if  Still in BCCCP. (15 min).

## 2015-06-23 ENCOUNTER — Telehealth: Payer: Self-pay

## 2015-06-23 NOTE — Telephone Encounter (Signed)
Called per interpreter Lavon Paganini for health coaching. Patient stated that she thought she needed more health coaching in person. Stated she would like to come in to office. Appointment schedules for 12/29 at 3 PM.

## 2015-07-21 ENCOUNTER — Ambulatory Visit: Payer: Self-pay

## 2015-07-21 DIAGNOSIS — Z789 Other specified health status: Secondary | ICD-10-CM

## 2015-07-21 NOTE — Progress Notes (Signed)
Patient returns today for more health coaching. Patient stated that could not do and stopped following. Went over materials with Lavon Paganini interpreter on portion sizes and number servings of food groups per day. Received more detailed handouts. Discussed different options of follow up with patient. Patient would like to come in monthly for next three months to weigh in and do health coaching.

## 2015-10-05 ENCOUNTER — Other Ambulatory Visit: Payer: Self-pay | Admitting: Obstetrics and Gynecology

## 2015-10-05 DIAGNOSIS — Z1231 Encounter for screening mammogram for malignant neoplasm of breast: Secondary | ICD-10-CM

## 2015-10-07 ENCOUNTER — Ambulatory Visit (HOSPITAL_COMMUNITY): Payer: Self-pay

## 2015-10-07 ENCOUNTER — Ambulatory Visit: Payer: Self-pay

## 2015-10-20 ENCOUNTER — Ambulatory Visit (HOSPITAL_COMMUNITY)
Admission: RE | Admit: 2015-10-20 | Discharge: 2015-10-20 | Disposition: A | Discharge status: Home / Self Care | Payer: Self-pay | Source: Ambulatory Visit | Attending: Obstetrics and Gynecology | Admitting: Obstetrics and Gynecology

## 2015-10-20 ENCOUNTER — Ambulatory Visit
Admission: RE | Admit: 2015-10-20 | Discharge: 2015-10-20 | Disposition: A | Discharge status: Home / Self Care | Payer: No Typology Code available for payment source | Source: Ambulatory Visit | Attending: Obstetrics and Gynecology | Admitting: Obstetrics and Gynecology

## 2015-10-20 ENCOUNTER — Encounter (HOSPITAL_COMMUNITY): Payer: Self-pay

## 2015-10-20 VITALS — BP 130/78 | Temp 98.2°F | Ht 62.0 in | Wt 160.0 lb

## 2015-10-20 DIAGNOSIS — Z1231 Encounter for screening mammogram for malignant neoplasm of breast: Secondary | ICD-10-CM

## 2015-10-20 DIAGNOSIS — Z01419 Encounter for gynecological examination (general) (routine) without abnormal findings: Secondary | ICD-10-CM

## 2015-10-20 NOTE — Patient Instructions (Signed)
Educational materials on self breast awareness given. Explained to [place] will cover Pap smears and HPV typing every 5 years unless has a history of abnormal Pap smears. Referred patient to the [facility] for a screening mammogram. Appointment scheduled for Thursday, October 20, 2015 at 1600. Patient aware of appointment and will be there. Let patient know the [facility] Center will follow up with her within the next couple weeks with results of mammogram by letter or phone. Let patient know that will follow up with her within the next couple of weeks with results of Pap smear by phone. Hoy Register verbalized understanding.  Cayde Held, [name], RN 3:09 PM

## 2015-10-20 NOTE — Progress Notes (Signed)
No complaints today.   Pap Smear: Pap smear completed today. Last Pap smear was in January 2014 by Dr. Ouida Sills at [facility] and normal per patient. Per patient has no history of an abnormal Pap smear. Last Pap smear result is not in EPIC but previous one on 06/15/2008 is in EPIC.  Physical exam: Breasts Breasts symmetrical. No skin abnormalities bilateral breasts. No nipple retraction bilateral breasts. No nipple discharge bilateral breasts. No lymphadenopathy. No lumps palpated bilateral breasts. No complaints of pain or tenderness on exam. Referred patient to the [facility] for a screening mammogram. Appointment scheduled for Thursday, October 20, 2015 at 1600.  Pelvic/Bimanual   Ext Genitalia No lesions, no swelling and no discharge observed on external genitalia.         Vagina Vagina pink and normal texture. No lesions or discharge observed in vagina.          Cervix Cervix is present. Cervix is beefy red around os and friable. No discharge observed.     Uterus Uterus is present and palpable. Uterus in normal position and normal size.        Adnexae Bilateral ovaries present and palpable. No tenderness on palpation.          Rectovaginal No rectal exam completed today since patient had no rectal complaints. No skin abnormalities observed on exam.    Smoking History: Patient has never smoked.  Patient Navigation: Patient education provided. Access to services provided for patient through [facility] program. Spanish interpreter provided.   Used Spanish interpreter [employer].

## 2015-10-21 ENCOUNTER — Telehealth (HOSPITAL_COMMUNITY): Payer: Self-pay | Admitting: *Deleted

## 2015-10-21 LAB — CYTOLOGY - PAP

## 2015-10-21 NOTE — Telephone Encounter (Signed)
Telephoned patient at home # and discussed negative pap smear results. HPV was negative. Next pap smear due in 5 years. Patient voice understanding. Used interpreter Anastasio Auerbach.

## 2015-11-01 ENCOUNTER — Encounter (HOSPITAL_COMMUNITY): Payer: Self-pay | Admitting: *Deleted

## 2015-11-03 ENCOUNTER — Other Ambulatory Visit: Payer: Self-pay

## 2015-11-03 ENCOUNTER — Ambulatory Visit: Payer: Self-pay

## 2015-11-03 VITALS — BP 110/78 | HR 68 | Temp 98.2°F | Resp 16 | Ht 60.0 in | Wt 159.0 lb

## 2015-11-03 DIAGNOSIS — Z0000 Encounter for general adult medical examination without abnormal findings: Secondary | ICD-10-CM

## 2015-11-03 NOTE — Patient Instructions (Signed)
Discussed health assessment with patient.She will be called with results of lab work and we will then discussed any further follow up the patient needs. Patient verbalized understanding. 

## 2015-11-03 NOTE — Progress Notes (Signed)
Patient is returning as a re screen to the [employer] and is currently a BCCCP patient effective 10/20/2015.  Clinical Measurements: Patient is 5 ft., weight 159 lbs, BMI 31.1 .   Medical History: Patient has no history of high cholesterol. Patient does not have a history of hypertension or diabetes. Per patient no diagnosed history of coronary heart disease, heart attack, heart failure, stroke/TIA, vascular disease or congenital heart defects.   Blood Pressure, Self-measurement: Patient states has no reason to check Blood pressure.  Nutrition Assessment: Patient stated that eats 2 fruits every day. Patient states she eats 3 servings of vegetables a day. Per patient states does eat 3 or more ounces or more of whole grains daily. Patient stated does not eat two or more servings of fish weekly. Patient states she does drink more than 36 ounces or 450 calories of beverages with added sugars weekly. Patient states she drinks a soda a day. Patient stated she does not watch her salt intake.  Physical Activity Assessment: Patient stated that does a total of 210 minutes of moderate exercise. Patient does not do any vigorous exercise.  Smoking Status: Patient has never smoked and is not exposed to smoke.  Quality of Life Assessment: In assessing patient's quality of life patient stated that out of the past 30 days that she has felt her health is good all of them. Patient also stated that in the past 30 days that her mental health was good including stress, depression and problems with emotions for all days. Patient did state that out of the past 30 days she felt her physical or mental health had not kept her from doing her usual activities including self-care, work or recreation.   Plan: Lab work done today including a lipid panel, blood glucose, and Hgb A1C. Will call lab results when they are finished. Will do Health Coaching/risk reduction when call results,

## 2015-11-04 LAB — LIPID PANEL
CHOL/HDL RATIO: 4.9 ratio — AB (ref 0.0–4.4)
CHOLESTEROL TOTAL: 193 mg/dL (ref 100–199)
HDL: 39 mg/dL — ABNORMAL LOW (ref >39)
LDL Calculated: 121 mg/dL — ABNORMAL HIGH (ref 0–99)
TRIGLYCERIDES: 166 mg/dL — AB (ref 0–149)
VLDL Cholesterol Cal: 33 mg/dL (ref 5–40)

## 2015-11-04 LAB — HEMOGLOBIN A1C
Est. average glucose Bld gHb Est-mCnc: 128 mg/dL
HEMOGLOBIN A1C: 6.1 % — AB (ref 4.8–5.6)

## 2015-11-09 ENCOUNTER — Telehealth: Payer: Self-pay

## 2015-11-09 NOTE — Telephone Encounter (Signed)
LAB RESULTS  Called to inform about lab work from 11/03/15. Interpreter Lavon Paganini informed patient: BMI 31.1, cholesterol- 193, HDL- 39, LDL- 121, triglycerides - 166, and HBG-A1C - 6.1.   RISK REDUCTION COUNSELING WITH HEALTH COACHING:Did risk reduction Health Coaching concerning BMI. Informed that normal BMI was 18 to 25 and patient is in obese range. Discussed the need to loose weight, decrease sugar in diet and starches with examples given. Informed patient needs to stay away from drinks and food that have too much sugar. Patient asked about what did she needs to do concerning low HDL's. Discussed that needed to use good oils for cooking, eat fish high in omega 3's, and fish oil supplement. Example were given of all foods and oils. Discussed briefly increasing fiber and exercise to help lower LDL's. Discussed portion sizes and avoiding fatty food.   PLAN: Scheduled one on one Health Coaching for May 11th at 8:30 AM. Will develop materials for patient  TIME SPENT WITH PATIENT: 15 minutes.Marland Kitchen

## 2015-12-01 ENCOUNTER — Ambulatory Visit: Payer: Self-pay

## 2015-12-01 DIAGNOSIS — Z789 Other specified health status: Secondary | ICD-10-CM

## 2015-12-01 NOTE — Progress Notes (Signed)
[place] Patient returns today for Health Coaching. No interpreter is present. Patient has had many sessions form her previous Nurse, adult.All her results improved except her LDL's and A1C.  HEALTH COACHING:  Patient and I went over lab results and compared to previous lab findings. Patient stated that she did not want to become a diabetic. We discussed how she had had all the teaching and information related to each lab that was elevated or low. Patient stated she knew what to do but was not consistent in eating or exercise. Using pictures she picked out what she eats a day. She was honest and knew what needed to be cut back.  I asked patient about how motivated was she? Patient stated that had trouble with being consistent. Patient and I talked about until she was really comitted to make a change for herself that it would not happen. Patient decided to make small changes like: First month cut out her sweet cookies or pastries daily with her coffee; Second month cut out two tortilla's; and third month to cut out that last real sugar soda.   Discussed getting a primary care doctor and went over Family medicine's packet, person to contact, and eligibility.  PLAN: Will Call in 3 to 4 weeks. Will decrease sweets.Will look at Primary care doctor.  TIME: 40 minutes

## 2015-12-01 NOTE — Patient Instructions (Signed)
Will not eat sweets with coffee. Will start exercise routine back. Will become more consistent in activities she does. Will call if has problems.

## 2015-12-09 ENCOUNTER — Telehealth: Payer: Self-pay

## 2015-12-09 NOTE — Telephone Encounter (Addendum)
[place] Patient called per phone today for Health Coaching with interpreter Lavon Paganini present. Patient's areas of concern were weight, Cholesterol, A1C and exercise.  HEALTH COACHING: Patient stated is developing healthier eating habits. Per patient has increased the amount of vegetables that she is eating. Patient stated that is not eating at night. Per patient is working on trying to increase fiber in diet by eating broccoli.  Patient states is eating 3 meals and a snack now. Patient stated is trying to get motivated to walk more.  Patient states is feeling much better.   PLAN: Will Call in 2 to 4 weeks. Will continue with increasing exercise. Will continue with the good meals and nutrition changes. Will be doing follow up assessment when call next.

## 2016-02-14 ENCOUNTER — Telehealth: Payer: Self-pay

## 2016-02-14 NOTE — Telephone Encounter (Signed)
Patient called per Anastasio Auerbach interpreter for final assessment.  ASSESSMENT :This is a follow up Assessment following [place] . Marland Kitchen  Medication Status : Patient states is not taking medication for high cholesterol, hypertension, or diabetes.   Blood Pressure, Self-measurement: Patient states has no reason to check Blood pressure.  Nutrition Assessment: Patient stated that eats 1 to 2 fruits every day. Patient states she eats 1 to 2 servings of vegetables a day. Per Patient eats 3 or more ounces of whole grains daily. Patient stated doesn't eat two or more servings of fish weekly.  Patient states she does not drink more than 36 ounces or 450 calories of beverages with added sugars weekly. Patient stated she does watch her salt intake.   Physical Activity Assessment: Patient stated she walks 3 days a week for thirty minutes. So patient does  around 90 minutes of moderate activity and no vigorous per week.  Smoking Status: Patient stated has never smoked and is not exposed to smoke.  Quality of Life Assessment: In assessing patient's Physical quality of life she stated that out of the past 30 days that she has felt her health is good all of them. Patient also stated that in the past 30 days that her mental health was good including stress, depression and problems with emotions for all days. Patient did state that out of the past 30 days she felt her physical or mental health had not kept her from doing her usual activities including self-care, work or recreation.   PLAN: Informed patient that would re screen if does not have insurance when and if she returns to [facility] next year. Patient stated that would like A1C rechecked in October at 6 month mark.  TIME SPENT with PATIENT: 15 minutes

## 2016-10-12 ENCOUNTER — Other Ambulatory Visit: Payer: Self-pay | Admitting: Obstetrics and Gynecology

## 2016-10-12 DIAGNOSIS — Z1231 Encounter for screening mammogram for malignant neoplasm of breast: Secondary | ICD-10-CM

## 2016-10-23 ENCOUNTER — Ambulatory Visit
Admission: RE | Admit: 2016-10-23 | Discharge: 2016-10-23 | Disposition: A | Discharge status: Home / Self Care | Payer: No Typology Code available for payment source | Source: Ambulatory Visit | Attending: Obstetrics and Gynecology | Admitting: Obstetrics and Gynecology

## 2016-10-23 ENCOUNTER — Encounter (HOSPITAL_COMMUNITY): Payer: Self-pay

## 2016-10-23 ENCOUNTER — Ambulatory Visit (HOSPITAL_COMMUNITY)
Admission: RE | Admit: 2016-10-23 | Discharge: 2016-10-23 | Disposition: A | Discharge status: Home / Self Care | Payer: Self-pay | Source: Ambulatory Visit | Attending: Obstetrics and Gynecology | Admitting: Obstetrics and Gynecology

## 2016-10-23 VITALS — Temp 98.0°F | Ht 60.0 in

## 2016-10-23 DIAGNOSIS — Z1231 Encounter for screening mammogram for malignant neoplasm of breast: Secondary | ICD-10-CM

## 2016-10-23 DIAGNOSIS — Z1239 Encounter for other screening for malignant neoplasm of breast: Secondary | ICD-10-CM

## 2016-10-23 NOTE — Patient Instructions (Signed)
Explained breast self awareness with Hoy Register. Patient did not need a Pap smear today due to last Pap smear and HPV typing was 10/20/2015. Let her know BCCCP will cover Pap smears and HPV typing every 5 years unless has a history of abnormal Pap smears. Referred patient to the [facility] for a screening mammogram. Appointment scheduled for Tuesday, October 23, 2016 at 0910.  Let patient know the Breast Center will follow up with her within the next couple weeks with results of mammogram by letter or phone. Hoy Register verbalized understanding.  Tallula Grindle, [name], RN 10:14 AM

## 2016-10-23 NOTE — Progress Notes (Signed)
No complaints today.   Pap Smear: Pap smear not completed today. Last Pap smear was 10/20/2015 at [facility] and normal with negative HPV. Per patient has no history of an abnormal Pap smear. Last Pap smear result is in EPIC.  Physical exam: Breasts Breasts symmetrical. No skin abnormalities bilateral breasts. No nipple retraction bilateral breasts. No nipple discharge bilateral breasts. No lymphadenopathy. No lumps palpated bilateral breasts. No complaints of pain or tenderness on exam. Referred patient to the [facility] for a screening mammogram. Appointment scheduled for Tuesday, October 23, 2016 at 0910.        Pelvic/Bimanual No Pap smear completed today since last Pap smear and HPV typing was 10/20/2015. Pap smear not indicated per BCCCP guidelines.   Smoking History: Patient has never smoked.  Patient Navigation: Patient education provided. Access to services provided for patient through [facility] program. Spanish interpreter provided.  Colorectal Cancer Screening: Per patient had a colonoscopy completed in 2013 due to rectal bleeding. No complaints today.  Used Spanish interpreter Truitt Merle from [facility].

## 2016-10-24 ENCOUNTER — Encounter (HOSPITAL_COMMUNITY): Payer: Self-pay | Admitting: *Deleted

## 2016-10-29 ENCOUNTER — Other Ambulatory Visit: Payer: Self-pay

## 2016-10-29 DIAGNOSIS — Z0000 Encounter for general adult medical examination without abnormal findings: Secondary | ICD-10-CM

## 2016-10-31 ENCOUNTER — Ambulatory Visit: Payer: Self-pay

## 2016-10-31 ENCOUNTER — Other Ambulatory Visit (HOSPITAL_BASED_OUTPATIENT_CLINIC_OR_DEPARTMENT_OTHER): Payer: Self-pay

## 2016-10-31 VITALS — BP 110/62 | Ht 61.0 in | Wt 162.0 lb

## 2016-10-31 DIAGNOSIS — Z0000 Encounter for general adult medical examination without abnormal findings: Secondary | ICD-10-CM

## 2016-10-31 LAB — GLUCOSE (CC13): GLUCOSE: 84 mg/dL (ref 70–140)

## 2016-10-31 NOTE — Progress Notes (Signed)
[facility] Initial Screening  Interpretor: Lavon Paganini   Clinical Measurement: HT:5'1 WT:162lb BP:110/62 BPx2:108/66 fasting labs drawn today, will review with patient when they result.   Medical History: patient states she does not have high cholesterol or HTN, she states she was told she was pre-diabetic last year. She has never been diagnoses with heart disease.   Medication: patient does not take any medications at this time.   Blood Pressure, Self Measurement: not applicable  Nutrition: patient eats approximately 1/2 cup of fruit a day, 1 cup of vegetable a day. She does not eat fish, she does not eat more than 3 ounces of whole grains a day. She drinks a can of soda a day. She does not watch her sodium intake.   Physical Activity:  Patient does not get any vigorous activity, she estimates 60 min a day of moderate activity at work.  Smoking Status: patient does not smoke.   Quality of Life: patient denies any mental/physical health days preventing her from performing her normal daily activities/   Risk Reduction and Counseling: patients goal is to cut back her sodas to 2 a week instead of one daily. Her main goal is weight loss but she will begin with her soda intake.   Navigation: Will call patient with lab results and second health coaching session within a week. She understands there will be 2 more health coaching sessions via phone call follow up by a final follow up call.

## 2016-11-01 LAB — LIPID PANEL
CHOL/HDL RATIO: 5.2 ratio — AB (ref 0.0–4.4)
Cholesterol, Total: 199 mg/dL (ref 100–199)
HDL: 38 mg/dL — AB (ref >39)
LDL Calculated: 100 mg/dL — ABNORMAL HIGH (ref 0–99)
Triglycerides: 307 mg/dL — ABNORMAL HIGH (ref 0–149)
VLDL CHOLESTEROL CAL: 61 mg/dL — AB (ref 5–40)

## 2016-11-01 LAB — HEMOGLOBIN A1C
ESTIMATED AVERAGE GLUCOSE: 111 mg/dL
HEMOGLOBIN A1C: 5.5 % (ref 4.8–5.6)

## 2016-11-12 ENCOUNTER — Telehealth: Payer: Self-pay

## 2016-11-12 NOTE — Telephone Encounter (Signed)
Health Coaching #3  Initial Screening: 10/31/2016  Interpretor:Julie Sowell   Goal: patients goal was to cut back on sodas to 2 a week. Patient is doing better with this goal..  Navigation: Time spent with patient via phone: 10 Minutes, patient understands there will be a final follow up call in 4- 6 weeks.

## 2016-11-12 NOTE — Telephone Encounter (Signed)
Health Coaching #2  Initial Screening:10/31/2016 Interpretor: Lavon Paganini  Labs: Fasting Nonfasting- Choelsterol:199 Triglycerides:307 LDL:100 HDL:38 Glucose:84 A1C:5.5 Patient lab results explained to her, she was informed to increase her physical activity and eat a low fat high fiber diet.  Goal:Patients goal was to cut back on sodas to 2 a week. She has been doing this and able to stay to 2-3 a week right now.  Navigation: Time spent with patient via phone: 10 Minutes,will be a third health coaching session within 1 week.

## 2017-04-29 ENCOUNTER — Encounter (HOSPITAL_COMMUNITY): Payer: Self-pay

## 2017-05-17 ENCOUNTER — Telehealth (HOSPITAL_COMMUNITY): Payer: Self-pay

## 2017-05-17 NOTE — Telephone Encounter (Signed)
[facility] Follow Up  Interpreter: Lavon Paganini  Medication: Patient does not need to take medication for HTN, hyperlipidemia or diabetes.   Blood Pressure, Self Measurement: N/A  Nutrition Assessment: The patient eats 1/2 cup of fruit daily. She eats 1 1/2 cups of vegetables daily. She eats 2 or more servings of fish weekly. She eats 3oz or more of whole grains daily. The patient drinks less than 36oz of sugar added beverages weekly and watches her salt intake.  Smoking Status: Patient does not smoke.  Quality of Life: Patient has not had any mental/physical health days in the last 30 days that has prevented her from completing her normal everyday tasks.  Navigation: Patient states that she would like to renew her W[facility]WOMAN when she is eligible.

## 2017-05-20 ENCOUNTER — Encounter (HOSPITAL_COMMUNITY): Payer: Self-pay

## 2017-10-29 ENCOUNTER — Other Ambulatory Visit (HOSPITAL_COMMUNITY): Payer: Self-pay | Admitting: *Deleted

## 2017-10-29 DIAGNOSIS — Z0000 Encounter for general adult medical examination without abnormal findings: Secondary | ICD-10-CM

## 2017-10-30 ENCOUNTER — Inpatient Hospital Stay: Payer: Self-pay | Attending: Obstetrics and Gynecology | Admitting: *Deleted

## 2017-10-30 ENCOUNTER — Inpatient Hospital Stay: Payer: Self-pay

## 2017-10-30 VITALS — BP 118/80 | Ht 60.0 in | Wt 159.6 lb

## 2017-10-30 DIAGNOSIS — Z0000 Encounter for general adult medical examination without abnormal findings: Secondary | ICD-10-CM

## 2017-10-30 LAB — HDL CHOLESTEROL: HDL: 40 mg/dL — ABNORMAL LOW (ref >40)

## 2017-10-30 LAB — HEMOGLOBIN A1C
Hgb A1c MFr Bld: 5.8 % — ABNORMAL HIGH (ref 4.8–5.6)
Mean Plasma Glucose: 119.76 mg/dL

## 2017-10-30 LAB — CHOLESTEROL, TOTAL: Cholesterol: 238 mg/dL — ABNORMAL HIGH (ref 0–200)

## 2017-10-30 NOTE — Patient Instructions (Signed)
Health coaching:  Patient states goals are to decrease the amount of drinks with sugar added. She also wants to increase her physical activity.  Encouraged patient to drink more water and to add exercise to her regimen.

## 2017-10-30 NOTE — Addendum Note (Signed)
Addended by: Tasia Catchings R on: 10/30/2017 01:46 PM   Modules accepted: Orders

## 2017-10-30 NOTE — Progress Notes (Signed)
Wisewoman initial screening   Interpreter  Almyra Free Sowell  Clinical Measurement:  Height:  60in. Weight: 159.6lbs.  Blood Pressure: 124/78 Blood Pressure #2: 118/80  Fasting Labs Drawn Today, will review with patient when they result.  Medical History:  Patient states that she has not been diagnosed with high cholesterol, high blood pressure, diabetes or heart disease.  She state her cholesterol was elevated the last time it was checked.   Medications:  Patients states she is not taking any medications for high cholesterol, high blood pressure or diabetes.  She is not taking aspirin daily to prevent heart attack or stroke.    Blood pressure, self measurement:  Patients states she does not measure blood pressure at home.    Nutrition:  Patient states she eats 1 cup of fruit and 2 cup of vegetables in an average day.  Patient states she does not eat fish regularly, she eats more than half a serving of whole grains daily. She drinks less than 36 ounces of beverages with added sugar weekly.  She is currently watching her sodium intake.  She has not had any drinks containing alcohol in the last seven days.    Physical activity:  Patient states that she gets 100 minutes of moderate exercise in a week.  She gets 300 minutes of vigorous exercise per week.    Smoking status:  Patient states she has never smoked and is not around any smokers.    Quality of life:  Patient states that she has had 0 bad physical days out of the last 30 days. In the last 2 weeks, she has had 0 days that she has felt down or depressed. She has had 0 days in the last 2 weeks that she has had little interest or pleasure in doing things.  Risk reduction and counseling:  Patient states she wants to decrease the  amount of drinks containing sugar and increase exercise.  I encouraged her to continue with current exercise regimen and drink more water.  Navigation:  I will notify patient of lab results.  Patient is aware of 2 more  health coaching sessions and a follow up.

## 2017-11-01 ENCOUNTER — Ambulatory Visit: Payer: Self-pay

## 2017-11-15 ENCOUNTER — Other Ambulatory Visit: Payer: Self-pay | Admitting: Obstetrics and Gynecology

## 2017-11-15 DIAGNOSIS — Z1231 Encounter for screening mammogram for malignant neoplasm of breast: Secondary | ICD-10-CM

## 2017-11-20 ENCOUNTER — Other Ambulatory Visit (HOSPITAL_COMMUNITY): Payer: Self-pay | Admitting: *Deleted

## 2017-11-22 ENCOUNTER — Inpatient Hospital Stay: Payer: Self-pay

## 2017-11-22 ENCOUNTER — Inpatient Hospital Stay: Payer: Self-pay | Attending: Obstetrics and Gynecology | Admitting: *Deleted

## 2017-11-22 DIAGNOSIS — Z0000 Encounter for general adult medical examination without abnormal findings: Secondary | ICD-10-CM

## 2017-11-29 ENCOUNTER — Telehealth (HOSPITAL_COMMUNITY): Payer: Self-pay | Admitting: *Deleted

## 2017-11-29 NOTE — Progress Notes (Signed)
Late entry for May 3,2019  [place] 2-Talked to patient about her lab results,which were out of range. Cholesterol level 238 and hemoglobin A1C 5.8 , for which I am making a clinical  referral. We talked about her goals, which included eating more fruits,vegetables and exercising more.

## 2017-11-29 NOTE — Patient Instructions (Signed)
Health Coaching 2

## 2017-11-29 NOTE — Progress Notes (Unsigned)
    Referral made for Outpatient Clinic at [facility] for May 16th regarding elevated A1C and cholesterol.

## 2017-12-02 NOTE — Telephone Encounter (Signed)
Wisewoman Follow-up  Initial Screening: April 10,2019  Interpreter-Julie Sowell  Medication: patient states she does not take medication for HTN,diabetes or hyperlipidemia  dispite having a total cholesterol level of 238 and A1c of 5.8  Navigation:  Referral made for the [facility] for Thursday May 16 at 10:15am regarding cholesterol and A1c.

## 2017-12-05 ENCOUNTER — Encounter: Payer: Self-pay | Admitting: Internal Medicine

## 2017-12-05 ENCOUNTER — Ambulatory Visit (INDEPENDENT_AMBULATORY_CARE_PROVIDER_SITE_OTHER): Payer: Self-pay | Admitting: Internal Medicine

## 2017-12-05 VITALS — BP 115/66 | HR 63 | Temp 98.7°F | Wt 162.6 lb

## 2017-12-05 DIAGNOSIS — R7303 Prediabetes: Secondary | ICD-10-CM

## 2017-12-05 DIAGNOSIS — R7309 Other abnormal glucose: Secondary | ICD-10-CM | POA: Insufficient documentation

## 2017-12-05 DIAGNOSIS — E785 Hyperlipidemia, unspecified: Secondary | ICD-10-CM | POA: Insufficient documentation

## 2017-12-05 DIAGNOSIS — E7800 Pure hypercholesterolemia, unspecified: Secondary | ICD-10-CM

## 2017-12-05 NOTE — Assessment & Plan Note (Signed)
  Prediabetes: hemoglobin A1c of 5.8 %.  We discussed at length the current treatment recommendations of diet and exercise.  I reinforced the concept of lifestyle modifications including exercise of at least 40 minutes daily 4 days a week at moderate levels including walking, weightlifting, cardiovascular training, or other such activity.  In addition I reinforced the concept of decreasing her eat daily sugar intake including decreasing sodas, teas, sweet foods, snack cakes, cake, bread, and to increase her vegetable and fish content in her diet.  Would recommend hemoglobin A1c in 2 months and continued reinforcement of dietary modifications Obtain a BMP today for renal function analysis with age >24

## 2017-12-05 NOTE — Assessment & Plan Note (Signed)
  Prediabetes: hemoglobin A1c of 5.8 %.  We discussed at length the current treatment recommendations of diet and exercise.  I reinforced the concept of lifestyle modifications including exercise of at least 40 minutes daily 4 days a week at moderate levels including walking, weightlifting, cardiovascular training, or other such activity.  In addition I reinforced the concept of decreasing her eat daily sugar intake including decreasing sodas, teas, sweet foods, snack cakes, cake, bread, and to increase her vegetable and fish content in her diet.  Would recommend hemoglobin A1c in 2 months and continued reinforcement of dietary modifications Obtain a BMP today for renal function analysis with age >23

## 2017-12-05 NOTE — Progress Notes (Signed)
   CC: Elevated cholesterol screening exam  HPI:Jackie Abbott is a 42 y.o. female who presents today for evaluation of elevated cholesterol on a routine screening exam and an hemoglobin A1c of 5.8%.  The patient was a recent [place] wellness visit when she was noted to have a cholesterol 238   Prediabetes: hemoglobin A1c of 5.8 %.  We discussed at length the current treatment recommendations of diet and exercise.  I reinforced the concept of lifestyle modifications including exercise of at least 40 minutes daily 4 days a week at moderate levels including walking, weightlifting, cardiovascular training, or other such activity.  In addition I reinforced the concept of decreasing her eat daily sugar intake including decreasing sodas, teas, sweet foods, snack cakes, cake, bread, and to increase her vegetable and fish content in her diet.  Would recommend hemoglobin A1c in 2 months and continued reinforcement of dietary modifications Obtain a BMP today for renal function analysis with age >65  Hypercholesterolemia: HDL 42 total cholesterol 238. Plan: We will obtain a lipid profile today for further evaluation of her hyperlipidemia No indication to initiate statin therapy at this time with ASCVD risk <5% (1.4%)  No past medical history on file.   Review of Systems: ROS negative except as per HPI.  Physical Exam:  Vitals:   12/05/17 1033  BP: 115/66  Pulse: 63  Temp: 98.7 F (37.1 C)  TempSrc: Oral  SpO2: 99%  Weight: 162 lb 9.6 oz (73.8 kg)   Physical Exam  Constitutional: She appears well-developed and well-nourished. No distress.  Cardiovascular: Normal rate and regular rhythm.  No murmur heard. Pulmonary/Chest: Effort normal and breath sounds normal. No stridor. No respiratory distress.  Abdominal: Soft. Bowel sounds are normal. She exhibits no distension. There is no tenderness.  Skin: She is not diaphoretic.  Vitals reviewed.  Assessment & Plan:   See Encounters  Tab for problem based charting.  Patient discussed with Dr. Dareen Piano

## 2017-12-05 NOTE — Assessment & Plan Note (Signed)
>>  ASSESSMENT AND PLAN FOR PURE HYPERCHOLESTEROLEMIA WRITTEN ON 12/05/2017 11:26 AM BY HARBRECHT, LAWRENCE, MD  Hypercholesterolemia: HDL 42 total cholesterol 238. Plan: We will obtain a lipid profile today for further evaluation of her hyperlipidemia No indication to initiate statin therapy at this time with ASCVD risk <5% (1.4%)

## 2017-12-05 NOTE — Assessment & Plan Note (Signed)
Hypercholesterolemia: HDL 42 total cholesterol 238. Plan: We will obtain a lipid profile today for further evaluation of her hyperlipidemia No indication to initiate statin therapy at this time with ASCVD risk <5% (1.4%)

## 2017-12-05 NOTE — Assessment & Plan Note (Signed)
>>  ASSESSMENT AND PLAN FOR ELEVATED HEMOGLOBIN A1C WRITTEN ON 12/05/2017 11:26 AM BY HARBRECHT, LAWRENCE, MD   Prediabetes: hemoglobin A1c of 5.8 %.  We discussed at length the current treatment recommendations of diet and exercise.  I reinforced the concept of lifestyle modifications including exercise of at least 40 minutes daily 4 days a week at moderate levels including walking, weightlifting, cardiovascular training, or other such activity.  In addition I reinforced the concept of decreasing her eat daily sugar intake including decreasing sodas, teas, sweet foods, snack cakes, cake, bread, and to increase her vegetable and fish content in her diet.  Would recommend hemoglobin A1c in 2 months and continued reinforcement of dietary modifications Obtain a BMP today for renal function analysis with age >18

## 2017-12-05 NOTE — Patient Instructions (Signed)
FOLLOW-UP INSTRUCTIONS When: Please stop at the front desk and schedule for an appointment in two months For: Routine visit What to bring: All of your medications  Thank you for your visit to [facility] internal medicine clinic today.  I have obtained some labs to better evaluate your cholesterol and overall kidney function.  I will call you with any concerning values  If any questions, please do not hesitate to call us anytime 24/7.

## 2017-12-06 LAB — BMP8+ANION GAP
Anion Gap: 15 mmol/L (ref 10.0–18.0)
BUN/Creatinine Ratio: 25 — ABNORMAL HIGH (ref 9–23)
BUN: 13 mg/dL (ref 6–24)
CALCIUM: 9.3 mg/dL (ref 8.7–10.2)
CHLORIDE: 105 mmol/L (ref 96–106)
CO2: 22 mmol/L (ref 20–29)
CREATININE: 0.52 mg/dL — AB (ref 0.57–1.00)
GFR calc non Af Amer: 117 mL/min/{1.73_m2} (ref >59)
GFR, EST AFRICAN AMERICAN: 135 mL/min/{1.73_m2} (ref >59)
GLUCOSE: 86 mg/dL (ref 65–99)
Potassium: 4 mmol/L (ref 3.5–5.2)
Sodium: 142 mmol/L (ref 134–144)

## 2017-12-06 LAB — LIPID PANEL
CHOLESTEROL TOTAL: 219 mg/dL — AB (ref 100–199)
Chol/HDL Ratio: 5.5 ratio — ABNORMAL HIGH (ref 0.0–4.4)
HDL: 40 mg/dL (ref >39)
LDL Calculated: 129 mg/dL — ABNORMAL HIGH (ref 0–99)
TRIGLYCERIDES: 249 mg/dL — AB (ref 0–149)
VLDL Cholesterol Cal: 50 mg/dL — ABNORMAL HIGH (ref 5–40)

## 2017-12-09 NOTE — Progress Notes (Signed)
Internal Medicine Clinic Attending  Case discussed with Dr. Harbrecht at the time of the visit.  We reviewed the resident's history and exam and pertinent patient test results.  I agree with the assessment, diagnosis, and plan of care documented in the resident's note.   

## 2017-12-10 ENCOUNTER — Ambulatory Visit (HOSPITAL_COMMUNITY)
Admission: RE | Admit: 2017-12-10 | Discharge: 2017-12-10 | Disposition: A | Discharge status: Home / Self Care | Payer: Self-pay | Source: Ambulatory Visit | Attending: Obstetrics and Gynecology | Admitting: Obstetrics and Gynecology

## 2017-12-10 ENCOUNTER — Encounter (HOSPITAL_COMMUNITY): Payer: Self-pay

## 2017-12-10 ENCOUNTER — Ambulatory Visit
Admission: RE | Admit: 2017-12-10 | Discharge: 2017-12-10 | Disposition: A | Discharge status: Home / Self Care | Payer: No Typology Code available for payment source | Source: Ambulatory Visit | Attending: Obstetrics and Gynecology | Admitting: Obstetrics and Gynecology

## 2017-12-10 VITALS — BP 112/60 | Wt 162.0 lb

## 2017-12-10 DIAGNOSIS — Z01419 Encounter for gynecological examination (general) (routine) without abnormal findings: Secondary | ICD-10-CM | POA: Insufficient documentation

## 2017-12-10 DIAGNOSIS — Z1231 Encounter for screening mammogram for malignant neoplasm of breast: Secondary | ICD-10-CM

## 2017-12-10 DIAGNOSIS — Z1239 Encounter for other screening for malignant neoplasm of breast: Secondary | ICD-10-CM

## 2017-12-10 NOTE — Patient Instructions (Signed)
Explained breast self awareness with [name]. Patient did not need a Pap smear today due to last Pap smear and HPV typing was 10/20/2015. Let her know BCCCP will cover Pap smears and HPV typing every 5 years unless has a history of abnormal Pap smears. Referred patient to the [place] for a screening mammogram. Appointment scheduled for Tuesday, Dec 10, 2017 at 1140. Patient aware of appointment and will be there. Let patient know the [facility] will follow up with her within the next couple weeks with results of mammogram by letter or phone. Jackie Abbott verbalized understanding.  [name], [name], RN 12:26 PM

## 2017-12-10 NOTE — Addendum Note (Signed)
Encounter addended by: Loletta Parish, RN on: 12/10/2017 12:59 PM  Actions taken: Sign clinical note

## 2017-12-10 NOTE — Progress Notes (Addendum)
No complaints today.   Patient had a negative UPT this a.m.  Pap Smear: Pap smear not completed today. Last Pap smear was 10/20/2015 at [facility] and normal with negative HPV. Per patient has no history of an abnormal Pap smear. Last Pap smear result is in EPIC.  Physical exam: Breasts Breasts symmetrical. No skin abnormalities right breast. Observed a red spot on the left breast at 10 o'clock where patient stated she scratched herself. No nipple retraction bilateral breasts. No nipple discharge bilateral breasts. No lymphadenopathy. No lumps palpated bilateral breasts. No complaints of pain or tenderness on exam. Referred patient to the [place] for a screening mammogram. Appointment scheduled for Tuesday, Dec 10, 2017 at 1140.        Pelvic/Bimanual No Pap smear completed today since last Pap smear and HPV typing was 10/20/2015. Pap smear not indicated per BCCCP guidelines.   Smoking History: Patient has never smoked.  Patient Navigation: Patient education provided. Access to services provided for patient through [facility] program. Spanish interpreter provided.   Breast and Cervical Cancer Risk Assessment: Patient has no family history of breast cancer, known genetic mutations, or radiation treatment to the chest before age 40. Patient has no history of cervical dysplasia, immunocompromised, or DES exposure in-utero. Patient has a 5-year risk of breast cancer at 0.5% and Lifetime risk of 7.7%.   Used Spanish interpreter [employer] from [place].

## 2017-12-27 ENCOUNTER — Encounter (HOSPITAL_COMMUNITY): Payer: Self-pay | Admitting: *Deleted

## 2018-05-26 IMAGING — MG DIGITAL SCREENING BILATERAL MAMMOGRAM WITH CAD
3 series · 3 of 3 positions shown · non-contrast
Comparison: Previous exam(s).

CLINICAL DATA: Screening.

EXAM:
DIGITAL SCREENING BILATERAL MAMMOGRAM WITH CAD

[L MLO]
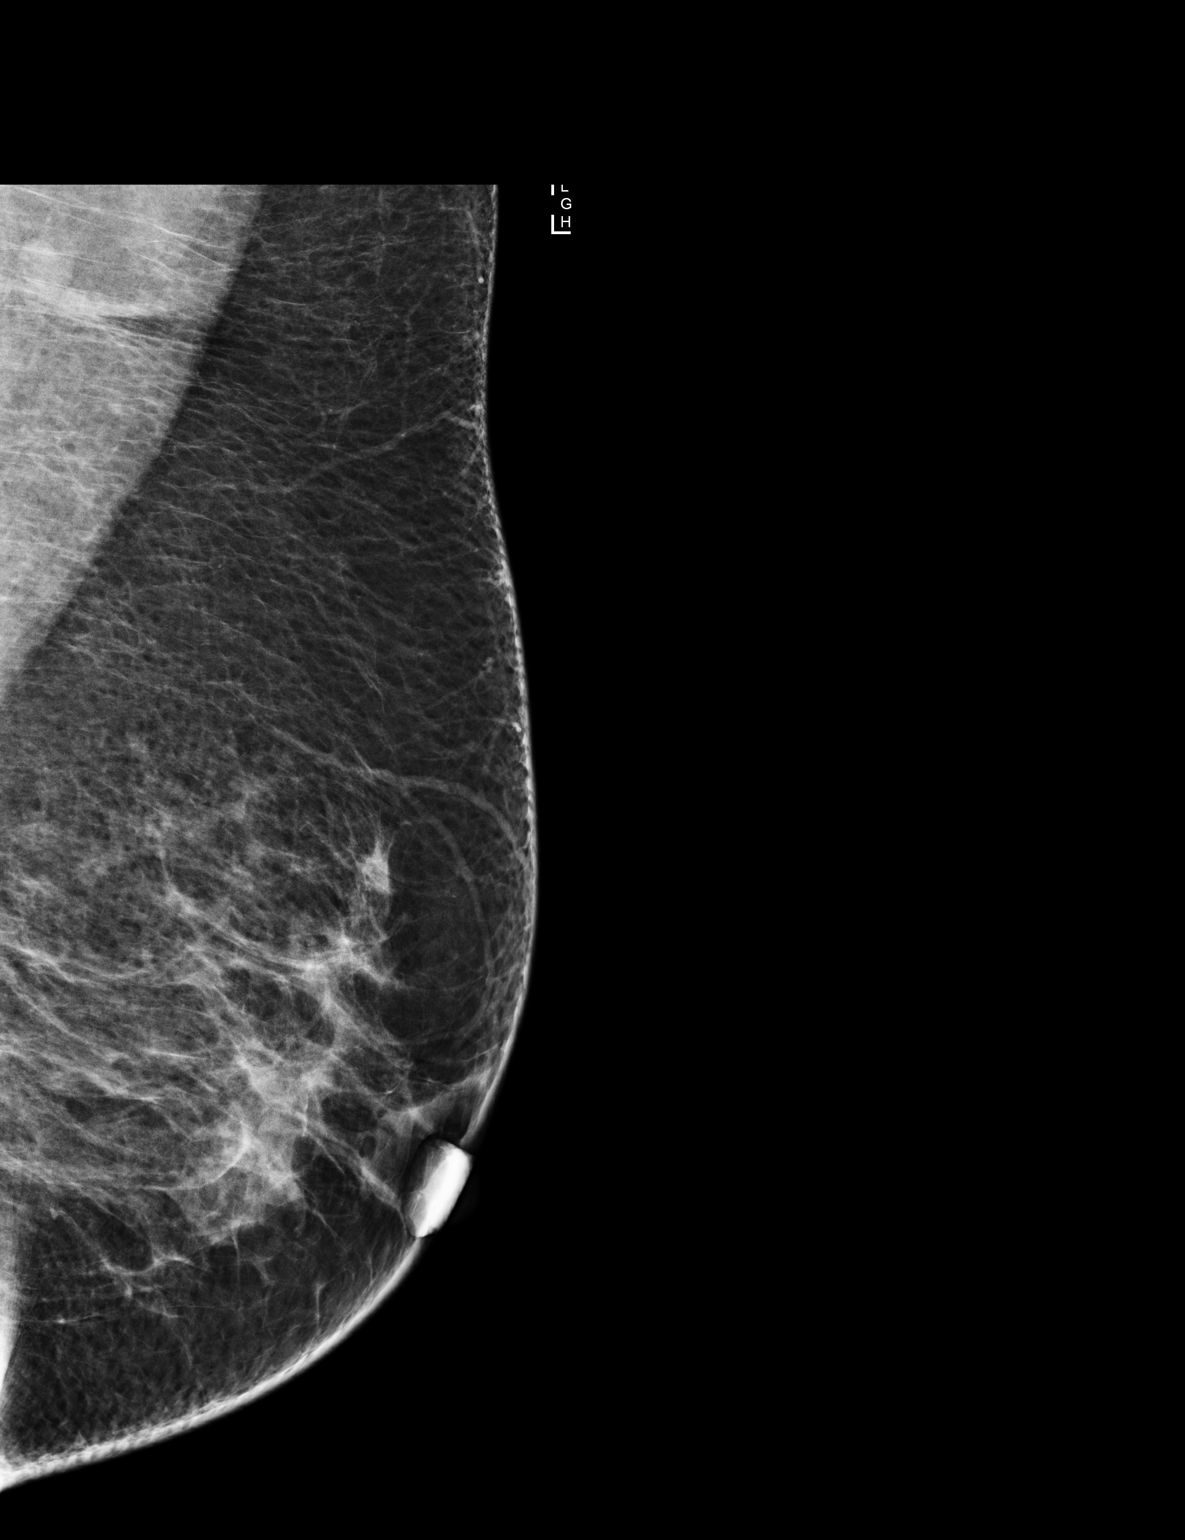

[R MLO]
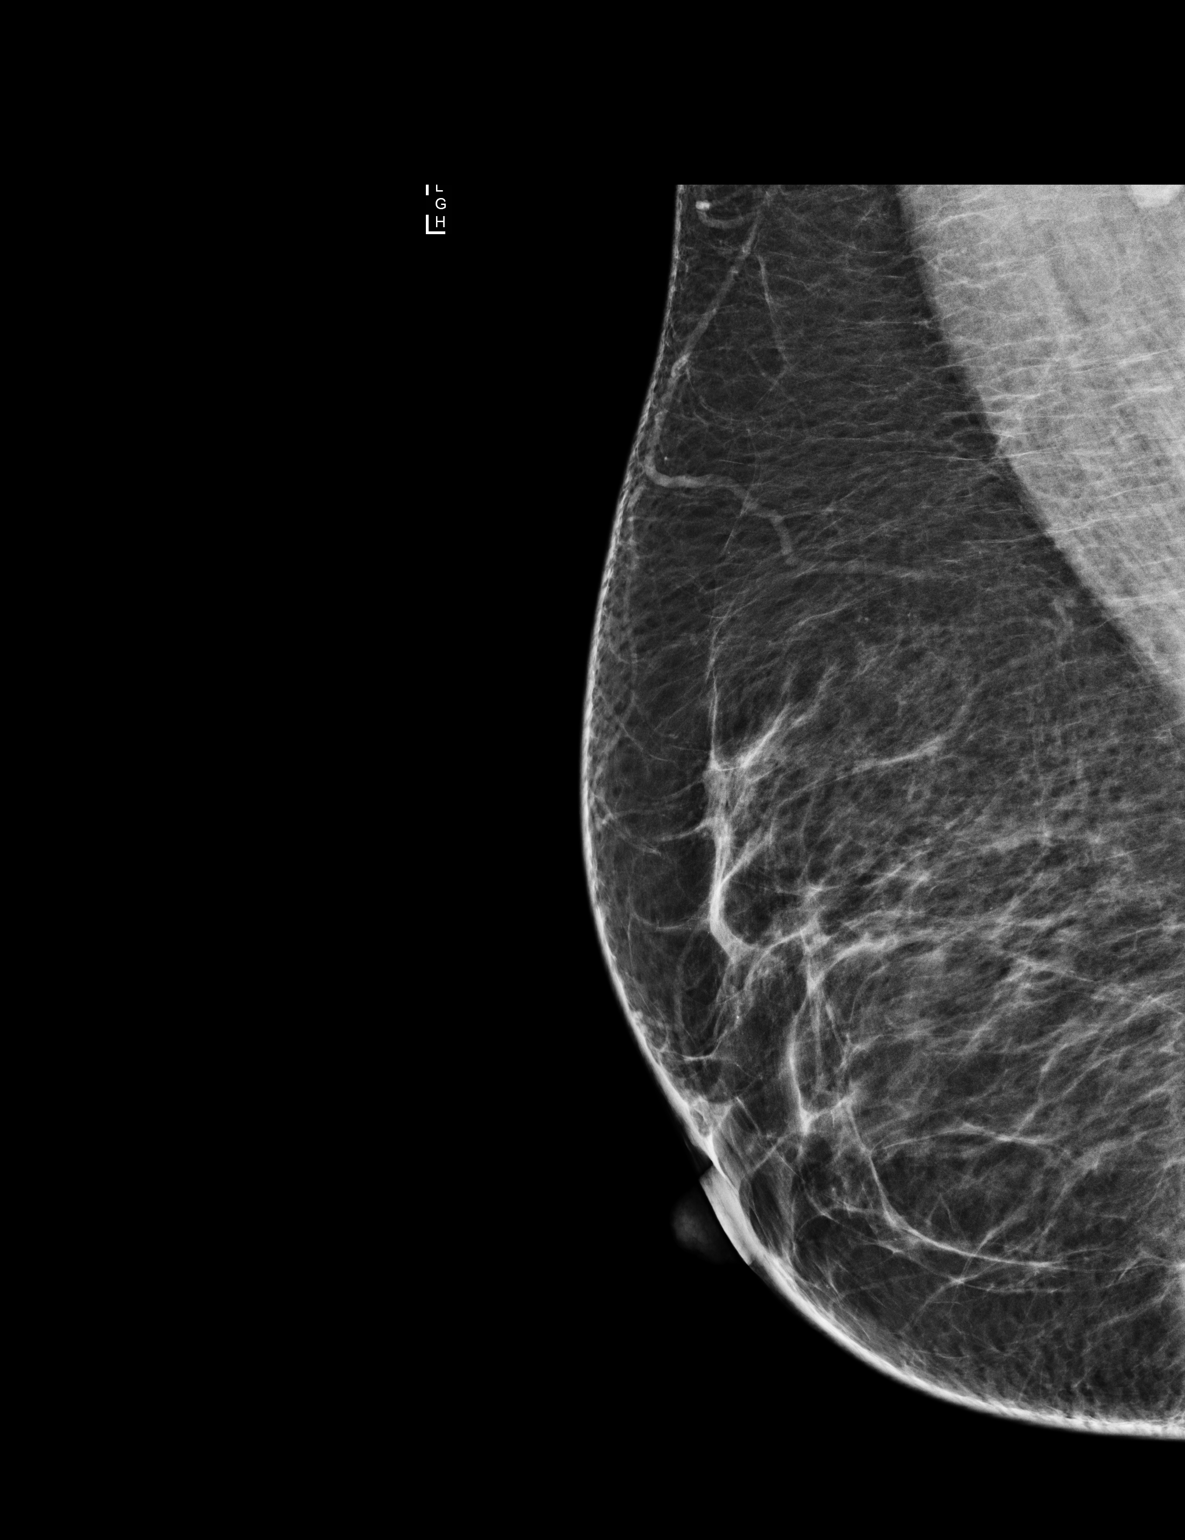

[L CC]
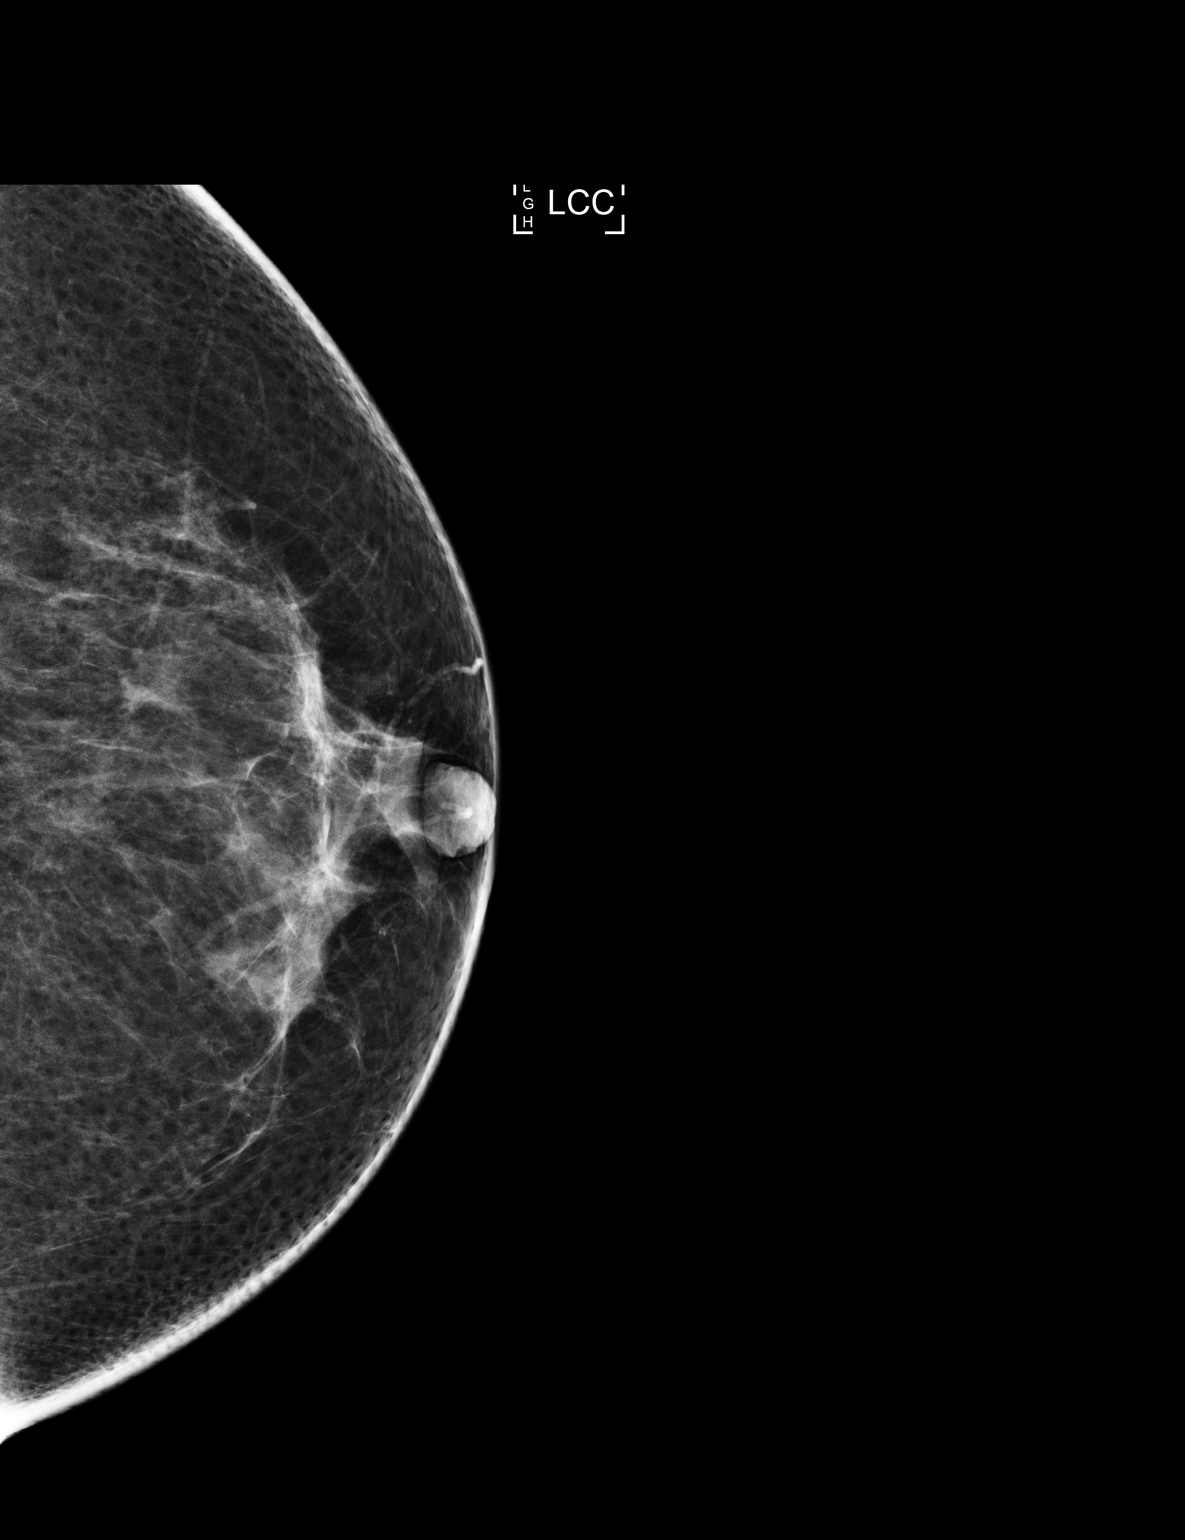

[3 of 3 positions shown; findings below may reference images not displayed]

ACR Breast Density Category b: There are scattered areas of
fibroglandular density.
FINDINGS: There are no findings suspicious for malignancy. Images were
processed with CAD.
IMPRESSION: No mammographic evidence of malignancy. A result letter of this
screening mammogram will be mailed directly to the patient.

RECOMMENDATION:
Screening mammogram in one year. (Code:AS-G-LCT)

BI-RADS CATEGORY  1: Negative.

## 2018-12-03 ENCOUNTER — Other Ambulatory Visit (HOSPITAL_COMMUNITY): Payer: Self-pay | Admitting: *Deleted

## 2018-12-03 DIAGNOSIS — Z1231 Encounter for screening mammogram for malignant neoplasm of breast: Secondary | ICD-10-CM

## 2019-04-14 ENCOUNTER — Other Ambulatory Visit: Payer: Self-pay

## 2019-04-14 ENCOUNTER — Ambulatory Visit (HOSPITAL_COMMUNITY)
Admission: RE | Admit: 2019-04-14 | Discharge: 2019-04-14 | Disposition: A | Discharge status: Home / Self Care | Payer: No Typology Code available for payment source | Source: Ambulatory Visit | Attending: Obstetrics and Gynecology | Admitting: Obstetrics and Gynecology

## 2019-04-14 ENCOUNTER — Encounter (HOSPITAL_COMMUNITY): Payer: Self-pay

## 2019-04-14 ENCOUNTER — Ambulatory Visit
Admission: RE | Admit: 2019-04-14 | Discharge: 2019-04-14 | Disposition: A | Discharge status: Home / Self Care | Payer: No Typology Code available for payment source | Source: Ambulatory Visit | Attending: Obstetrics and Gynecology | Admitting: Obstetrics and Gynecology

## 2019-04-14 DIAGNOSIS — Z1231 Encounter for screening mammogram for malignant neoplasm of breast: Secondary | ICD-10-CM

## 2019-04-14 DIAGNOSIS — Z1239 Encounter for other screening for malignant neoplasm of breast: Secondary | ICD-10-CM | POA: Insufficient documentation

## 2019-04-14 NOTE — Progress Notes (Signed)
No complaints today.   Pap Smear: Pap smear not completed today. Last Pap smear was 10/20/2015 at [facility] and normal with negative HPV. Per patient has no history of an abnormal Pap smear. Last Pap smear result is in EPIC.  Physical exam: Breasts Breasts symmetrical. No skin abnormalities bilateral breasts. No nipple retraction bilateral breasts. No nipple discharge bilateral breasts. No lymphadenopathy. No lumps palpated bilateral breasts. No complaints of pain or tenderness on exam. Referred patient to the [place] for a screening mammogram. Appointment scheduled for Tuesday, April 14, 2019 at 1120.        Pelvic/Bimanual No Pap smear completed today since last Pap smearand HPV typing was3/30/2017. Pap smear not indicated per BCCCP guidelines.  Smoking History: Patient has never smoked.  Patient Navigation: Patient education provided. Access to services provided for patient through [facility] program. Spanish interpreter provided.   Breast and Cervical Cancer Risk Assessment: Patient has no family history of breast cancer, known genetic mutations, or radiation treatment to the chest before age 9. Patient has no history of cervical dysplasia, immunocompromised, or DES exposure in-utero.  Risk Assessment    Risk Scores      04/14/2019 12/10/2017   Last edited by: Armond Hang, LPN Rolena Infante H, LPN   5-year risk: 0.6 % 0.5 %   Lifetime risk: 7.5 % 7.7 %         Used Spanish interpreter Rudene Anda from [place].

## 2019-04-14 NOTE — Patient Instructions (Signed)
Explained breast self awareness with Jackie Abbott. Patient did not need a Pap smear today due to last Pap smear and HPV typing was 10/20/2015. Let her know BCCCP will cover Pap smears and HPV typing every 5 years unless has a history of abnormal Pap smears. Referred patient to the [place] for a screening mammogram. Appointment scheduled for Tuesday, April 14, 2019 at 1120. Patient aware of appointment and will be there. Let patient know the [facility] will follow up with her within the next couple weeks with results of mammogram by letter or phone. Jackie Abbott verbalized understanding.  Allie Gerhold, [name], RN 10:37 AM

## 2019-04-15 ENCOUNTER — Encounter (HOSPITAL_COMMUNITY): Payer: Self-pay | Admitting: *Deleted

## 2019-04-15 ENCOUNTER — Inpatient Hospital Stay: Payer: Self-pay | Attending: Obstetrics and Gynecology | Admitting: *Deleted

## 2019-04-15 ENCOUNTER — Other Ambulatory Visit: Payer: Self-pay

## 2019-04-15 VITALS — BP 118/82 | Temp 97.1°F | Ht 61.75 in | Wt 157.0 lb

## 2019-04-15 DIAGNOSIS — Z0000 Encounter for general adult medical examination without abnormal findings: Secondary | ICD-10-CM

## 2019-04-15 NOTE — Progress Notes (Signed)
Wisewoman initial screening    Clinical Measurement:  Height: 61.75 in Weight: 157 lb  Blood Pressure: 124/98  Blood Pressure #2: 118/82 Fasting Labs Drawn Today, will review with patient when they result.   Medical History:  Patient states that she has a history of high cholesterol. Patient does not have a history high blood pressure or diabetes.  Medications:  Patient states that she does not take medication to lower cholesterol, blood pressure and blood sugar.  Patient does not take an aspirin a day to help prevent a heart attack or stroke.    Blood pressure, self measurement: Patient states that she does not measure blood pressure from home.   Nutrition: Patient states that on average she eats 2 cups of fruit and 2 cups of vegetables per day. Patient states that she does not eat fish at least 2 times per week. Patient eats less than haf servings of whole grains. Patient does not drink less than 36 ounces of beverages with added sugar weekly. Patient is currently watching sodium or salt intake. In the past 7 days patient has not had any drinks containing alcohol. On average patient does not drink any drinks containing alcohol.      Physical activity:  Patient states that she gets 60 minutes of moderate and 0 minutes of vigorous physical activity each week.  Smoking status:  Patient states that she has never smoked tobacco.   Quality of life:  Over the past 2 weeks patient states that she has not had any days where she has little interest or pleasure in doing things and 0 days where she has felt down, depressed or hopeless.    Risk reduction and counseling:    Will talk in more detail about diet and exercise when I call to give lab results. Patient came late for appointment after interpreter had already left.   Navigation:  I will notify patient of lab results.  Patient is aware of 2 more health coaching sessions and a follow up.  Time: 20 minutes

## 2019-04-16 LAB — GLUCOSE, RANDOM: Glucose: 96 mg/dL (ref 65–99)

## 2019-04-16 LAB — LIPID PANEL W/O CHOL/HDL RATIO
Cholesterol, Total: 247 mg/dL — ABNORMAL HIGH (ref 100–199)
HDL: 39 mg/dL — ABNORMAL LOW (ref >39)
LDL Chol Calc (NIH): 160 mg/dL — ABNORMAL HIGH (ref 0–99)
Triglycerides: 255 mg/dL — ABNORMAL HIGH (ref 0–149)
VLDL Cholesterol Cal: 48 mg/dL — ABNORMAL HIGH (ref 5–40)

## 2019-04-16 LAB — SPECIMEN STATUS REPORT

## 2019-04-16 LAB — HGB A1C W/O EAG: Hgb A1c MFr Bld: 5.9 % — ABNORMAL HIGH (ref 4.8–5.6)

## 2019-04-20 ENCOUNTER — Telehealth: Payer: Self-pay

## 2019-04-20 NOTE — Telephone Encounter (Signed)
Health coaching 2   interpreter-   Labs-cholesterol , LDL cholesterol , triglycerides , HDL cholesterol , hemoglobin A1C , mean plasma glucose   Patient understands and is aware of her lab results.   Goals- Discussed lab results with patient. Answered any questions that patient had regarding results.  Health Coaching- Reduce the amount of fried and fatty foods consumed. Limit the amount of red meat consumed. Look for reduced fat or low-fat dairy products. Add in healthy fats like olive oil, avocados and nuts into diet. Try and eat more whole grain products. Watch the amount of sweets consumed. Reduce the amount of sodas consumed to less than 36 ounces a week. Try and start walking for at least 30 minutes a day.    Navigation:  Patient is aware of 1 more health coaching sessions and a follow up. Patient is scheduled for follow-up at [facility] on Wednesday, October 14th @ 10:30 am for elevated labs and blood pressure.  Time- 23 minutes

## 2019-04-28 ENCOUNTER — Encounter: Payer: No Typology Code available for payment source | Admitting: Radiation Oncology

## 2019-05-06 ENCOUNTER — Ambulatory Visit (INDEPENDENT_AMBULATORY_CARE_PROVIDER_SITE_OTHER): Payer: Self-pay | Admitting: Internal Medicine

## 2019-05-06 ENCOUNTER — Encounter: Payer: Self-pay | Admitting: Internal Medicine

## 2019-05-06 ENCOUNTER — Other Ambulatory Visit: Payer: Self-pay

## 2019-05-06 DIAGNOSIS — E785 Hyperlipidemia, unspecified: Secondary | ICD-10-CM

## 2019-05-06 DIAGNOSIS — N950 Postmenopausal bleeding: Secondary | ICD-10-CM | POA: Insufficient documentation

## 2019-05-06 DIAGNOSIS — E782 Mixed hyperlipidemia: Secondary | ICD-10-CM

## 2019-05-06 NOTE — Progress Notes (Signed)
   CC: High cholesterol   HPI:[name] is a 43 y.o. with past medical history below. Please see problem based charting for details of presentation, assessment , and plan.   Review of Systems:  Review of Systems  Constitutional: Negative for chills and fever.  HENT: Negative for congestion and sinus pain.   Eyes: Negative for blurred vision and double vision.  Respiratory: Negative for shortness of breath and wheezing.   Cardiovascular: Negative for chest pain and leg swelling.  Gastrointestinal: Negative for constipation and diarrhea.  Endo/Heme/Allergies: Negative for environmental allergies and polydipsia.  Psychiatric/Behavioral: Negative for depression and substance abuse.   Past Medical History:  Diagnosis Date  . Gestational diabetes    Past Surgical History:  Procedure Laterality Date  . COLONOSCOPY  06/10/2012   Procedure: COLONOSCOPY;  Surgeon: Milus Banister, MD;  Location: [facility];  Service: Endoscopy;  Laterality: N/A;   Family History  Problem Relation Age of Onset  . Diabetes Father   . 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 Spending time with family is her favorite thing to do.  Social History   Tobacco Use  . Smoking status: Never Smoker  . Smokeless tobacco: Never Used  Substance Use Topics  . Alcohol use: No  . Drug use: No   Medications: Not taking any medications  Allergies: NKA   Physical Exam:  Vitals:   05/06/19 1029  BP: 123/77  Pulse: (!) 58  Temp: 98.1 F (36.7 C)  TempSrc: Oral  SpO2: 99%  Weight: 158 lb 4.8 oz (71.8 kg)    Physical Exam Constitutional:      General: She is not in acute distress.    Appearance: Normal appearance.  Cardiovascular:     Rate and Rhythm: Normal rate and regular rhythm.  Pulmonary:     Breath sounds: No wheezing, rhonchi or rales.  Abdominal:     General: Bowel sounds are normal.     Palpations: Abdomen is soft.     Tenderness: There is  no abdominal tenderness. There is no guarding.  Skin:    General: Skin is warm and dry.  Neurological:     Mental Status: She is alert and oriented to person, place, and time.  Psychiatric:        Mood and Affect: Mood normal.        Behavior: Behavior normal.      Assessment & Plan:   See Encounters Tab for problem based charting.  Patient seen with Dr. Evette Doffing

## 2019-05-06 NOTE — Patient Instructions (Signed)
Gracias por confiarme su atencin mdica. Hoy discutimos lo siguiente.  Colesterol alto - Contine desafindose a s mismo a hacer ejercicio y comer de manera saludable. Tiene un riesgo bajo de enfermedad cardiovascular en los prximos 10 aos.  Sangrado vaginal - Es probable que an est Proofreader. Haga un seguimiento en 3 meses para ser reevaluado.  Mi mejor,  Tamsen Snider, MD

## 2019-05-06 NOTE — Assessment & Plan Note (Signed)
Patient presented for follow-up of elevated cholesterol.  In addition she was concerned she had a period this month. Her last period was approximately 1.5 years ago.  Her first menstrual period was around age 43.  She was on birth control once and gained weight so stopped taking it.  Her husband uses condoms for birth control. She has 3 daughter and does not want more children.   Patient was amenorrheic for greater than 1 year, clinically defining post menopause. Patient's last Pap smear was 10/20/2015 at [facility] and normal with negative HPV. She denies any history of abnormal Pap smear, fibroids, or being told of any abnormal findings on her ovaries.  I expect patient is still in a transitional period and her recent vaginal bleeding is normal. Patient is meeting with financial counselor on Nov 2nd to receive help with medical cost. I would like to follow-up in 3 months with the patient for this problem and if patient continues to have post-menopausal bleeding I will send her for transvaginal ultrasound.

## 2019-05-07 NOTE — Progress Notes (Signed)
Internal Medicine Clinic Attending  I saw and evaluated the patient.  I personally confirmed the key portions of the history and exam documented by Dr. Steen and I reviewed pertinent patient test results.  The assessment, diagnosis, and plan were formulated together and I agree with the documentation in the resident's note.     

## 2019-05-21 ENCOUNTER — Telehealth (HOSPITAL_COMMUNITY): Payer: Self-pay

## 2019-05-21 NOTE — Telephone Encounter (Signed)
Health Coaching 3  interpreter- Rudene Anda, [facility]   Goals- Patient has been walking for 20 minutes a day 7 days a week. Encouraged patient to keep up this great work. Patient has reduced the amount of fried and fatty foods that she consumes. Patient has also eliminated sodas from her diet and increased the amount of water that she drinks.   New goal- Continue with current changes.  Barrier to reaching goal- None   Strategies to overcome- NA   Navigation:  Patient is aware of  a follow up session. Patient is scheduled for follow-up on November 30th @ 2:00 pm.   Time- 10 minutes

## 2019-05-25 ENCOUNTER — Ambulatory Visit: Payer: Self-pay

## 2019-05-25 ENCOUNTER — Other Ambulatory Visit: Payer: Self-pay

## 2019-06-10 ENCOUNTER — Ambulatory Visit: Payer: No Typology Code available for payment source

## 2019-09-09 ENCOUNTER — Other Ambulatory Visit: Payer: Self-pay

## 2019-09-09 ENCOUNTER — Inpatient Hospital Stay: Payer: Self-pay | Attending: Obstetrics and Gynecology | Admitting: *Deleted

## 2019-09-09 VITALS — BP 126/78 | Temp 97.3°F | Ht 61.75 in | Wt 151.5 lb

## 2019-09-09 DIAGNOSIS — Z0000 Encounter for general adult medical examination without abnormal findings: Secondary | ICD-10-CM

## 2019-09-10 NOTE — Progress Notes (Signed)
Wisewoman follow up   Interpreter: Rudene Anda, [employer]  Clinical Measurement:  Height: 61.75 in Weight: 151.5 lb  Blood Pressure: 120/78  Blood Pressure #2: 126/78    Medical History: Patient states that she does have a history of high cholesterol. Patient does not have a history of high blood pressure or diabetes.   Medications:  Patient states that she does not take medication to lower cholesterol, blood pressure or blood sugar. Patient does not take an aspirin a day to help prevent a heart attack or stroke.   Blood pressure, self measurement:  Patient states that she does not measure blood pressure from home and has not been told to do so by a healthcare provider.    Nutrition:  Patient states that on average she eats 2 cups of fruit and 3 cups of vegetables per day. Patient states that she does not eat fish at least 2 times per week. In a typical day patient eats less than half servings of whole grains. Patient drinks less than 36 ounces of beverages with added sugar weekly. Patient is currently watching sodium or salt intake. In the past 7 days patient has not had any drinks containing alcohol. On average patient does not drink any drinks containing alcohol.       Physical activity:  Patient states that she gets 20 minutes of moderate and 0 minutes of vigorous physical activity each week.  Smoking status:  Patient states that she has never smoked tobacco.   Quality of life:  Over the past 2 weeks patient states that she has had 0 days where she has little interest or pleasure in doing things and 0 days where she has felt down, depressed or hopeless.   Health Coaching: Encouraged patient to continue with daily fruit and vegetable intake. Patient has been eating tilapia 1 time per week. Encouraged her to add in a heart healthy fish at least 1 time per week. Gave options of salmon, tuna, mackerel or sardines to try. Patient has been eating some whole grains but not every day. Gave options  for different whole grains to try like brown rice, quinoa, whole grain cereals, whole wheat bread, whole wheat pasta or oatmeal. Patient has been walking outside for about 20 minutes a week since it has gotten cold. Encouraged patient to take advantage of the warmer days to do outside walking and on the other days try and do exercise inside for at least 20 minutes a day.   Navigation: This was the  follow up session for this patient, I will check up on her progress in the coming months.  Time: 20 minutes

## 2019-09-17 NOTE — Progress Notes (Signed)
Patient seen and assessed by nursing staff during this encounter. I have reviewed the chart and agree with the documentation and plan.  Mora Bellman, MD 09/17/2019 10:17 AM

## 2020-05-03 ENCOUNTER — Other Ambulatory Visit: Payer: Self-pay | Admitting: Obstetrics and Gynecology

## 2020-05-03 DIAGNOSIS — Z1231 Encounter for screening mammogram for malignant neoplasm of breast: Secondary | ICD-10-CM

## 2020-07-12 ENCOUNTER — Other Ambulatory Visit: Payer: Self-pay

## 2020-07-12 ENCOUNTER — Ambulatory Visit
Admission: RE | Admit: 2020-07-12 | Discharge: 2020-07-12 | Disposition: A | Discharge status: Home / Self Care | Payer: No Typology Code available for payment source | Source: Ambulatory Visit | Attending: Obstetrics and Gynecology | Admitting: Obstetrics and Gynecology

## 2020-07-12 ENCOUNTER — Encounter (INDEPENDENT_AMBULATORY_CARE_PROVIDER_SITE_OTHER): Payer: Self-pay

## 2020-07-12 ENCOUNTER — Ambulatory Visit: Payer: Self-pay | Admitting: *Deleted

## 2020-07-12 VITALS — BP 112/76 | Wt 156.4 lb

## 2020-07-12 DIAGNOSIS — Z1239 Encounter for other screening for malignant neoplasm of breast: Secondary | ICD-10-CM

## 2020-07-12 DIAGNOSIS — Z1231 Encounter for screening mammogram for malignant neoplasm of breast: Secondary | ICD-10-CM

## 2020-07-12 NOTE — Patient Instructions (Addendum)
Explained breast self awareness with Jackie Abbott. Patient did not need a Pap smear today due to last Pap smear and HPV typing was 10/20/2015. Let her know [facility] will cover Pap smears and HPV typing every 5 years unless has a history of abnormal Pap smears. Pap smear scheduled at the free cervical cancer screening on Monday, October 24, 2020 at 0830. Patient aware of appointment and will be there. Referred patient to the [facility] for a screening mammogram on the mobile unit. Appointment scheduled Tuesday, July 12, 2020 at 1000. Patient escorted to the mobile unit following [facility] appointment for her screening mammogram. Let patient know the [facility] will follow up with her within the next couple weeks with results of her mammogram by letter or phone. Jackie Abbott verbalized understanding.  Mickael Mcnutt, [name], RN 9:04 AM

## 2020-07-12 NOTE — Progress Notes (Addendum)
Ms. Natausha Jungwirth is a 44 y.o. female who presents to [facility] clinic today with no complaints.    Pap Smear: Pap smear not completed today. Last Pap smear was 10/20/2015 at [facility] and normal with negative HPV. Per patient has no history of an abnormal Pap smear. Last Pap smear result is available in Epic.   Physical exam: Breasts Breasts symmetrical. No skin abnormalities bilateral breasts. No nipple retraction bilateral breasts. No nipple discharge bilateral breasts. No lymphadenopathy. No lumps palpated bilateral breasts. No complaints of pain or tenderness on exam.       Pelvic/Bimanual Pap is not indicated today per B[facility]CP guidelines.   Smoking History: Patient has never smoked.   Patient Navigation: Patient education provided. Access to services provided for patient through [place] program. Spanish interpreter Rudene Anda from [facility] provided.    Breast and Cervical Cancer Risk Assessment: Patient does not have family history of breast cancer, known genetic mutations, or radiation treatment to the chest before age 7. Patient does not have history of cervical dysplasia, immunocompromised, or DES exposure in-utero.  Risk Assessment    Risk Scores      07/12/2020 04/14/2019   Last edited by: Demetrius Revel, LPN Rolena Infante H, LPN   5-year risk: 0.7 % 0.6 %   Lifetime risk: 7.4 % 7.5 %          A: [facility] exam without pap smear No complaints.  P: Referred patient to the [facility] for a screening mammogram on the mobile unit. Appointment scheduled Tuesday, July 12, 2020 at 1000.  Loletta Parish, RN 07/12/2020 9:06 AM

## 2020-10-24 ENCOUNTER — Other Ambulatory Visit: Payer: Self-pay

## 2020-10-24 ENCOUNTER — Other Ambulatory Visit: Payer: Self-pay | Admitting: *Deleted

## 2020-10-24 DIAGNOSIS — Z124 Encounter for screening for malignant neoplasm of cervix: Secondary | ICD-10-CM

## 2020-10-24 NOTE — Progress Notes (Addendum)
Patient: Jackie Abbott           Date of Birth: Nov 18, 1975           MRN: [id number] Visit Date: 10/24/2020 PCP: Madalyn Rob, MD  Cervical Cancer Screening Do you smoke?: No Have you ever had or been told you have an allergy to latex products?: No Marital status: Married Date of last pap smear: 2-5 yrs ago (10/20/2015) Date of last menstrual period:  (post-menopausal) Number of pregnancies: 3 Number of births: 3 Have you ever had any of the following? Hysterectomy: Don't Know Tubal ligation (tubes tied): No Abnormal bleeding: No Abnormal pap smear: No Venereal warts: No A sex partner with venereal warts: No A high risk* sex partner: No  Cervical Exam  Abnormal Observations: Scant amount of white colored discharge in vagina. Recommendations: Last Pap smear was 10/20/2015 at [facility] and normal with negative HPV. Per patient has no history of an abnormal Pap smear. Let patient know that if today's Pap smear is normal and HPV negative that her next Pap smear is due in 5 years. Informed patient that will follow-up with her within the next couple of weeks with results of Pap smear by letter or phone.    Used Spanish interpreter [employer] from [place].  Patient's History Patient Active Problem List   Diagnosis Date Noted  . Post-menopausal bleeding 05/06/2019  . Hyperlipidemia 05/06/2019  . Screening breast examination 04/14/2019  . Elevated hemoglobin A1c 12/05/2017  . Pure hypercholesterolemia 12/05/2017  . Hemorrhoids 06/10/2012  . 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 Blood per rectum 06/08/2012  . Hyperglycemia 06/08/2012  . Menorrhagia 06/08/2012  . Anemia 06/08/2012  . Back pain 01/31/2012  . DENTAL CARIES 11/15/2008  . DIABETES MELLITUS, GESTATIONAL, HX OF 06/15/2008   Past Medical History:  Diagnosis Date  . Gestational diabetes     Family History  Problem Relation Age of Onset  . Diabetes Father   . Hypertension Sister   . Diabetes Sister      Social History   Occupational History  . Not on file  Tobacco Use  . Smoking status: Never Smoker  . Smokeless tobacco: Never Used  Vaping Use  . Vaping Use: Never used  Substance and Sexual Activity  . Alcohol use: No  . Drug use: No  . Sexual activity: Yes    Birth control/protection: Condom

## 2020-10-26 LAB — CYTOLOGY - PAP
Comment: NEGATIVE
Diagnosis: NEGATIVE
High risk HPV: NEGATIVE

## 2020-10-27 ENCOUNTER — Telehealth: Payer: Self-pay

## 2020-10-27 NOTE — Telephone Encounter (Signed)
Attempted to call patient to give pap results. Left name and number for patient to call back.

## 2020-10-28 ENCOUNTER — Telehealth: Payer: Self-pay

## 2020-10-28 NOTE — Telephone Encounter (Signed)
Called patient to give pap smear results via [place] [phone]. Informed patient that pap smear was normal and HPV was negative. Based on this result her next pap smear will be due in 5 years. Patient voiced understanding.

## 2020-11-23 ENCOUNTER — Inpatient Hospital Stay: Payer: Self-pay | Attending: Obstetrics and Gynecology | Admitting: *Deleted

## 2020-11-23 ENCOUNTER — Other Ambulatory Visit: Payer: Self-pay

## 2020-11-23 VITALS — BP 120/84 | Ht 60.0 in | Wt 161.6 lb

## 2020-11-23 DIAGNOSIS — Z0000 Encounter for general adult medical examination without abnormal findings: Secondary | ICD-10-CM

## 2020-11-23 NOTE — Progress Notes (Signed)
  Wisewoman initial [place], Erling Cruz   Clinical Measurement:  Vitals:   11/23/20 0926  BP: 118/84   Fasting Labs Drawn Today, will review with patient when they result.   Medical History:  Patient states that she has high cholesterol, does not have high blood pressure and she does not have diabetes.  Medications:  Patient states that she does not take medication to lower cholesterol, blood pressure or blood sugar.  Patient does not take an aspirin a day to help prevent a heart attack or stroke.    Blood pressure, self measurement: Patient states that she does not measure blood pressure from home. She checks her blood pressure N/A. She shares her readings with a health care provider: N/A.   Nutrition: Patient states that on average she eats 1 cups of fruit and 2 cups of vegetables per day. Patient states that she does not eat fish at least 2 times per week. Patient eats less than half servings of whole grains. Patient drinks less than 36 ounces of beverages with added sugar weekly: yes. Patient is currently watching sodium or salt intake: no. In the past 7 days patient has consumed drinks containing alcohol on 0 days. On a day that patient consumes drinks containing alcohol on average 2 drinks are consumed.      Physical activity:  Patient states that she gets 0 minutes of moderate and 0 minutes of vigorous physical activity each week.  Smoking status:  Patient states that she has has never smoked .   Quality of life:  Over the past 2 weeks patient states that she had little interest or pleasure in doing things: not at all. She has been feeling down, depressed or hopeless:not at all.    Risk reduction and counseling:   Spoke with patient about the daily recommendation for fruits and vegetables. Also spoke with patient about practicing a heart healthy diet. Patient has hx of elevated cholesterol. Spoke with patient  about adding heart healthy fish in diet and as well as whole grains. Gave examples of whole grains that patient can try. Patient stated that she will look for a whole grain cereal like cheerios. Encouraged patient to try and avoid fried and fatty foods when possible. Patient has not been exercising recently but said that she wants to start walking now that the weather is getting warmer.    Navigation:  I will notify patient of lab results.  Patient is aware of 2 more health coaching sessions and a follow up.  Time: 30 minutes

## 2020-11-24 LAB — LIPID PANEL
Chol/HDL Ratio: 6.3 ratio — ABNORMAL HIGH (ref 0.0–4.4)
Cholesterol, Total: 273 mg/dL — ABNORMAL HIGH (ref 100–199)
HDL: 43 mg/dL (ref >39)
LDL Chol Calc (NIH): 198 mg/dL — ABNORMAL HIGH (ref 0–99)
Triglycerides: 172 mg/dL — ABNORMAL HIGH (ref 0–149)
VLDL Cholesterol Cal: 32 mg/dL (ref 5–40)

## 2020-11-24 LAB — HEMOGLOBIN A1C
Est. average glucose Bld gHb Est-mCnc: 128 mg/dL
Hgb A1c MFr Bld: 6.1 % — ABNORMAL HIGH (ref 4.8–5.6)

## 2020-11-24 LAB — GLUCOSE, RANDOM: Glucose: 104 mg/dL — ABNORMAL HIGH (ref 65–99)

## 2020-11-28 ENCOUNTER — Telehealth: Payer: Self-pay

## 2020-11-28 NOTE — Telephone Encounter (Signed)
Health coaching 2   interpreter- Rudene Anda, [place] cholesterol, 198 LDL cholesterol, 172 triglycerides, 43 HDL cholesterol, 6.1 hemoglobin A1C, 104 mean plasma glucose. Patient understands and is aware of her lab results.   Goals-  1. Reduce the amount of fried and fatty foods consumed. 2. Increase the amount of fruits and vegetables consumed daily. 3. Increase the amount of heart healthy fish consumed (salmon, tuna, mackerel, sardines or seabass). 4. Increase the amount of whole grains consumed. 5. Watch the amount of sweets and sugars consumed in both food and drinks. 6. Reduce the amount of carbs consumed.  Challenged patient to try and start exercising for at least 20 minutes a day. Stressed the importance of getting blood sugar under control so that it does not turn into diabetes later on.   Navigation:  Patient is aware of 1 more health coaching sessions and a follow up. Will call patient with follow-up appointment information once appointment is scheduled with Internal Medicine. Referred patient to the [facility] [place] diabetes prevention program. Someone from the program will reach out to the patient to see if she would be interested in registering for the program.  Time-  12 minutes

## 2020-12-08 ENCOUNTER — Encounter: Payer: Self-pay | Admitting: Internal Medicine

## 2020-12-08 ENCOUNTER — Ambulatory Visit (INDEPENDENT_AMBULATORY_CARE_PROVIDER_SITE_OTHER): Payer: Self-pay | Admitting: Internal Medicine

## 2020-12-08 ENCOUNTER — Other Ambulatory Visit: Payer: Self-pay

## 2020-12-08 VITALS — BP 124/78 | HR 66 | Temp 98.4°F | Ht 60.0 in | Wt 161.6 lb

## 2020-12-08 DIAGNOSIS — E782 Mixed hyperlipidemia: Secondary | ICD-10-CM

## 2020-12-08 DIAGNOSIS — R7303 Prediabetes: Secondary | ICD-10-CM

## 2020-12-08 DIAGNOSIS — N950 Postmenopausal bleeding: Secondary | ICD-10-CM

## 2020-12-08 DIAGNOSIS — M5412 Radiculopathy, cervical region: Secondary | ICD-10-CM

## 2020-12-08 MED ORDER — ATORVASTATIN CALCIUM 40 MG PO TABS: 40.0000 mg | ORAL_TABLET | Freq: Every day | ORAL | 11 refills | Status: DC

## 2020-12-08 NOTE — Assessment & Plan Note (Signed)
Patient asked to be evaluated for numbness and tingling in her right hand.  Patient experiences the numbness starting in her neck and going down to her right arm in a median nerve distribution.  She denies any recent trauma.  She mostly experiences this when laying down at night and it goes away during the day.   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 Given general recommendations for care.

## 2020-12-08 NOTE — Assessment & Plan Note (Signed)
Lab Results  Component Value Date   HGBA1C 6.1 (H) 11/23/2020   Assessment/Plan: Encouraging strict diet and exercise - Repeat hemoglobin a1c at next visit if 3 months from this date

## 2020-12-08 NOTE — Assessment & Plan Note (Signed)
Patient reports her vaginal bleeding has resolved after our last visit in 2020. No further workup at this time.

## 2020-12-08 NOTE — Progress Notes (Signed)
   CC: Mixed hyperlipidemia, Prediabetes, Cervical Radiculopathy with median nerve distrubution  HPI:Ms.Geneal Valine Drozdowski is a 45 y.o. female who presents for evaluation of mixed hyperlipidemia, prediabetes, and cervical radiculopathy . Please see individual problem based A/P for details.  Review of Systems:   Review of Systems  Constitutional: Negative for chills and fever.  Musculoskeletal: Negative for falls and neck pain.  Neurological: Positive for tingling. Negative for focal weakness.     Physical Exam: Vitals:   12/08/20 0952  BP: 124/78  Pulse: 66  Temp: 98.4 F (36.9 C)  TempSrc: Oral  SpO2: 98%  Weight: 161 lb 9.6 oz (73.3 kg)  Height: 5' (1.524 m)   General: Middle age spanish speaking women in NAD HEENT: Conjunctiva nl , antiicteric sclerae, moist mucous membranes, no exudate or erythema Cardiovascular: Normal rate, regular rhythm.  No murmurs, rubs, or gallops Pulmonary : Equal breath sounds, No wheezes, rales, or rhonchi Abdominal: soft, nontender,  bowel sounds present Ext: Numbness in median nerve distrubution, Verrucae Vulgaris on right middle finger  Assessment & Plan:   See Encounters Tab for problem based charting.  Patient discussed with Dr. Evette Doffing

## 2020-12-08 NOTE — Assessment & Plan Note (Signed)
Patient had repeat lipid panel with [facility] program this year and LDL > 190. This is for primary prevention.  Assessment/Plan: Mixed hyperlipidemia - CMP14 + Anion Gap - atorvastatin (LIPITOR) 40 MG tablet; Take 1 tablet (40 mg total) by mouth daily.  Dispense: 30 tablet; Refill: 11

## 2020-12-08 NOTE — Patient Instructions (Signed)
Thank you for trusting me with your care. To recap, today we discussed the following:    Mixed hyperlipidemia  - CMP14 + Anion Gap - atorvastatin (LIPITOR) 40 MG tablet; Take 1 tablet (40 mg total) by mouth daily.  Dispense: 30 tablet; Refill: 11   Prediabetes  - I recommended strict diet and exercise.   Cervical Radiculopathy, median nerve distrubution   - consider xray at your next visit. - rest and ice area. Take all pillows off bed except the one you sleep with.

## 2020-12-09 LAB — CMP14 + ANION GAP
ALT: 23 IU/L (ref 0–32)
AST: 28 IU/L (ref 0–40)
Albumin/Globulin Ratio: 1.6 (ref 1.2–2.2)
Albumin: 4.5 g/dL (ref 3.8–4.8)
Alkaline Phosphatase: 132 IU/L — ABNORMAL HIGH (ref 44–121)
Anion Gap: 15 mmol/L (ref 10.0–18.0)
BUN/Creatinine Ratio: 19 (ref 9–23)
BUN: 11 mg/dL (ref 6–24)
Bilirubin Total: 0.3 mg/dL (ref 0.0–1.2)
CO2: 21 mmol/L (ref 20–29)
Calcium: 9.6 mg/dL (ref 8.7–10.2)
Chloride: 104 mmol/L (ref 96–106)
Creatinine, Ser: 0.58 mg/dL (ref 0.57–1.00)
Globulin, Total: 2.8 g/dL (ref 1.5–4.5)
Glucose: 101 mg/dL — ABNORMAL HIGH (ref 65–99)
Potassium: 3.8 mmol/L (ref 3.5–5.2)
Sodium: 140 mmol/L (ref 134–144)
Total Protein: 7.3 g/dL (ref 6.0–8.5)
eGFR: 113 mL/min/{1.73_m2} (ref >59)

## 2020-12-12 NOTE — Progress Notes (Signed)
Internal Medicine Clinic Attending  Case discussed with Dr. Steen  At the time of the visit.  We reviewed the resident's history and exam and pertinent patient test results.  I agree with the assessment, diagnosis, and plan of care documented in the resident's note.  

## 2020-12-16 ENCOUNTER — Telehealth: Payer: Self-pay | Admitting: Internal Medicine

## 2020-12-16 DIAGNOSIS — R748 Abnormal levels of other serum enzymes: Secondary | ICD-10-CM

## 2020-12-16 NOTE — Telephone Encounter (Signed)
Alk Phos elevated at recent check . Called patient and she will complete repeat lab work next week.

## 2020-12-22 ENCOUNTER — Ambulatory Visit: Payer: No Typology Code available for payment source

## 2021-01-24 ENCOUNTER — Encounter: Payer: Self-pay | Admitting: *Deleted

## 2021-07-03 ENCOUNTER — Other Ambulatory Visit: Payer: Self-pay

## 2021-07-03 DIAGNOSIS — Z1231 Encounter for screening mammogram for malignant neoplasm of breast: Secondary | ICD-10-CM

## 2021-07-25 ENCOUNTER — Ambulatory Visit
Admission: RE | Admit: 2021-07-25 | Discharge: 2021-07-25 | Disposition: A | Discharge status: Home / Self Care | Payer: No Typology Code available for payment source | Source: Ambulatory Visit | Attending: Obstetrics and Gynecology | Admitting: Obstetrics and Gynecology

## 2021-07-25 ENCOUNTER — Other Ambulatory Visit: Payer: Self-pay

## 2021-07-25 ENCOUNTER — Ambulatory Visit: Payer: Self-pay | Admitting: *Deleted

## 2021-07-25 VITALS — BP 118/74 | Wt 160.6 lb

## 2021-07-25 DIAGNOSIS — Z1211 Encounter for screening for malignant neoplasm of colon: Secondary | ICD-10-CM

## 2021-07-25 DIAGNOSIS — Z1239 Encounter for other screening for malignant neoplasm of breast: Secondary | ICD-10-CM

## 2021-07-25 DIAGNOSIS — Z1231 Encounter for screening mammogram for malignant neoplasm of breast: Secondary | ICD-10-CM

## 2021-07-25 NOTE — Progress Notes (Signed)
Ms. Delia Slatten is a 46 y.o. female who presents to [facility] clinic today with no complaints.    Pap Smear: Pap smear not completed today. Last Pap smear was 10/24/2020 at the free cervical cancer screening clinic and was normal with negative HPV. Per patient has no history of an abnormal Pap smear. Last Pap smear result is available in Epic.   Physical exam: Breasts Breasts symmetrical. No skin abnormalities bilateral breasts. No nipple retraction bilateral breasts. No nipple discharge bilateral breasts. No lymphadenopathy. No lumps palpated bilateral breasts. No complaints of pain or tenderness on exam.  MS DIGITAL SCREENING BILATERAL  Result Date: 10/23/2016 CLINICAL DATA:  Screening. EXAM: DIGITAL SCREENING BILATERAL MAMMOGRAM WITH CAD COMPARISON:  Previous exam(s). ACR Breast Density Category b: There are scattered areas of fibroglandular density. FINDINGS: There are no findings suspicious for malignancy. Images were processed with CAD. IMPRESSION: No mammographic evidence of malignancy. A result letter of this screening mammogram will be mailed directly to the patient. RECOMMENDATION: Screening mammogram in one year. (Code:SM-B-01Y) BI-RADS CATEGORY  1: Negative. Electronically Signed   By: Fidela Salisbury M.D.   On: 10/23/2016 13:02   MS DIGITAL SCREENING TOMO BILATERAL  Result Date: 07/14/2020 CLINICAL DATA:  Screening. EXAM: DIGITAL SCREENING BILATERAL MAMMOGRAM WITH TOMO AND CAD COMPARISON:  Previous exam(s). ACR Breast Density Category b: There are scattered areas of fibroglandular density. FINDINGS: There are no findings suspicious for malignancy. Images were processed with CAD. IMPRESSION: No mammographic evidence of malignancy. A result letter of this screening mammogram will be mailed directly to the patient. RECOMMENDATION: Screening mammogram in one year. (Code:SM-B-01Y) BI-RADS CATEGORY  1: Negative. Electronically Signed   By: Claudie Revering M.D.   On: 07/14/2020 11:57   MS  DIGITAL SCREENING TOMO BILATERAL  Result Date: 04/14/2019 CLINICAL DATA:  Screening. EXAM: DIGITAL SCREENING BILATERAL MAMMOGRAM WITH TOMO AND CAD COMPARISON:  Previous exam(s). ACR Breast Density Category b: There are scattered areas of fibroglandular density. FINDINGS: There are no findings suspicious for malignancy. Images were processed with CAD. IMPRESSION: No mammographic evidence of malignancy. A result letter of this screening mammogram will be mailed directly to the patient. RECOMMENDATION: Screening mammogram in one year. (Code:SM-B-01Y) BI-RADS CATEGORY  1: Negative. Electronically Signed   By: Abelardo Diesel M.D.   On: 04/14/2019 16:08   MS DIGITAL SCREENING TOMO BILATERAL  Result Date: 12/11/2017 CLINICAL DATA:  Screening. EXAM: DIGITAL SCREENING BILATERAL MAMMOGRAM WITH TOMO AND CAD COMPARISON:  Previous exam(s). ACR Breast Density Category b: There are scattered areas of fibroglandular density. FINDINGS: There are no findings suspicious for malignancy. Images were processed with CAD. IMPRESSION: No mammographic evidence of malignancy. A result letter of this screening mammogram will be mailed directly to the patient. RECOMMENDATION: Screening mammogram in one year. (Code:SM-B-01Y) BI-RADS CATEGORY  1: Negative. Electronically Signed   By: Marin Olp M.D.   On: 12/11/2017 07:59         Pelvic/Bimanual Pap is not indicated today per BCCCP guidelines.   Smoking History: Patient has never smoked.   Patient Navigation: Patient education provided. Access to services provided for patient through [facility] program. Spanish interpreter Marta Col from [facility] provided.   Colorectal Cancer Screening: Per patient has had colonoscopy completed on 06/10/2012. FIT Test given to patient to complete.  No complaints today.    Breast and Cervical Cancer Risk Assessment: Patient does not have family history of breast cancer, known genetic mutations, or radiation treatment to the chest before age 25.  Patient does not  have history of cervical dysplasia, immunocompromised, or DES exposure in-utero.  Risk Assessment     Risk Scores       07/25/2021 07/12/2020   Last edited by: Demetrius Revel, LPN McGill, Sherie Mamie Nick, LPN   5-year risk: 0.7 % 0.7 %   Lifetime risk: 7.3 % 7.4 %            A: [facility]CP exam without pap smear No complaints.  P: Referred patient to the [place] [facility] for a screening mammogram on mobile unit. Appointment scheduled Tuesday, July 25, 2021 at 1500.  Loletta Parish, RN 07/25/2021 2:04 PM

## 2021-07-25 NOTE — Patient Instructions (Addendum)
Explained breast self awareness with [name]. Patient did not need a Pap smear today due to last Pap smear and HPV typing was 10/24/2020. Let her know [facility] will cover Pap smears and HPV typing every 5 years unless has a history of abnormal Pap smears. Referred patient to the [facility] for a screening mammogram on mobile unit. Appointment scheduled Tuesday, July 25, 2021 at 1500. Patient escorted to the mobile unit following [facility] appointment for her screening. Let patient know the [facility] will follow up with her within the next couple weeks with results of her mammogram by letter or phone. Jackie Abbott verbalized understanding.  Alsace Dowd, [name], RN 2:04 PM

## 2021-08-24 ENCOUNTER — Telehealth: Payer: Self-pay

## 2021-08-24 NOTE — Telephone Encounter (Signed)
Health Coaching 3  interpreter- Rudene Anda, [facility]   Goals- Patient has not been able to exercise recently since the weather has gotten colder. Patient admits that she has not been doing well with diet.    New goal- Try to find some type of exercise that she can do indoors while the weather is cold. Try to get back on track with heart healthy diet as well as cutting back on the amount of sweets and sugars consumed as well as carbs.  Barrier to reaching goal-    Strategies to overcome-    Navigation:  Patient is aware of  a follow up session. Patient is scheduled for follow-up appointment on October 11, 2021 @ 10:00 am. Encouraged patient to refill cholesterol medication. Patient stated that she only took if for a period of time and did not refill after that. Informed patient that PCP did order refills for 1 year for medication.    Time-  10 minutes

## 2021-10-11 ENCOUNTER — Other Ambulatory Visit: Payer: Self-pay

## 2021-10-11 ENCOUNTER — Inpatient Hospital Stay: Payer: Self-pay | Attending: Obstetrics and Gynecology | Admitting: *Deleted

## 2021-10-11 VITALS — BP 110/80 | Ht 60.0 in | Wt 159.4 lb

## 2021-10-11 DIAGNOSIS — Z0000 Encounter for general adult medical examination without abnormal findings: Secondary | ICD-10-CM

## 2021-10-11 NOTE — Progress Notes (Signed)
Wisewoman follow up ?  ?Interpreter: Rudene Anda, [employer] ? ?Clinical Measurement:   ?Vitals:  ? 10/11/21 1005  ?BP: 114/80  ?  ?  ?Medical History:  Patient states that she has high cholesterol, does not have high blood pressure and she does not have diabetes. ? ?Medications:  Patient states that she does take medication to lower cholesterol. Patient does not take medication to lower blood pressure or blood sugar. Patient does not take an aspirin a day to help prevent a heart attack or stroke. During the past 7 days patient has not taken prescribed medication to lower cholesterol on 0 days. ?  ?Blood pressure, self measurement Patient states that she does not measure blood pressure from home. She checks her blood pressure N/A. She shares her readings with a health care provider: N/A. ?  ?Nutrition: Patient states that on average she eats 0 cups of fruit and 0 cups of vegetables per day. Patient states that she does not eat fish at least 2 times per week. Patient eats less than half servings of whole grains. Patient drinks less than 36 ounces of beverages with added sugar weekly: yes. Patient is currently watching sodium or salt intake: no. In the past 7 days patient has had 0 drinks containing alcohol. On average patient drinks 0 drinks containing alcohol per day.     ? ?Physical activity:  Patient states that she gets 60 minutes of moderate and 0 minutes of vigorous physical activity each week. ? ?Smoking status:  Patient states that she has has never smoked . ?  ?Quality of life:  Over the past 2 weeks patient states that she had little interest or pleasure in doing things: several days. She has been feeling down, depressed or hopeless:not at all.  ?  ?Risk reduction and counseling:  ? ?Health Coaching: Spoke with patient about trying to start practicing a more heart healthy diet. Spoke with patient about trying to add fruits and vegetables into daily diet even if it is just one serving of each daily. Patient  does not consume fish regularly. Spoke with patient about the benefits of consuming fish in regards to heart health and lowering cholesterol levels. Patient does not consume whole grains. Gave examples of whole grains that patient can try adding into diet. Encouraged patient to try and start exercising daily for 20 minutes.   ?  ?Navigation: This was the  follow up session for this patient, I will check up on her progress in the coming months. ? ?Time: 25 minutes  ?

## 2021-10-16 ENCOUNTER — Other Ambulatory Visit: Payer: Self-pay

## 2021-10-16 ENCOUNTER — Other Ambulatory Visit (HOSPITAL_COMMUNITY): Payer: Self-pay

## 2021-10-16 ENCOUNTER — Ambulatory Visit: Payer: Self-pay | Admitting: Internal Medicine

## 2021-10-16 ENCOUNTER — Encounter: Payer: Self-pay | Admitting: Internal Medicine

## 2021-10-16 DIAGNOSIS — M5412 Radiculopathy, cervical region: Secondary | ICD-10-CM

## 2021-10-16 DIAGNOSIS — R748 Abnormal levels of other serum enzymes: Secondary | ICD-10-CM

## 2021-10-16 DIAGNOSIS — R200 Anesthesia of skin: Secondary | ICD-10-CM

## 2021-10-16 DIAGNOSIS — R7303 Prediabetes: Secondary | ICD-10-CM

## 2021-10-16 DIAGNOSIS — E782 Mixed hyperlipidemia: Secondary | ICD-10-CM

## 2021-10-16 MED ORDER — ATORVASTATIN CALCIUM 40 MG PO TABS
40.0000 mg | ORAL_TABLET | Freq: Every day | ORAL | 11 refills | Status: DC
  Filled 2021-10-16 – 2022-01-31 (×2): qty 30, 30d supply, fill #0
  Filled 2022-04-04: qty 30, 30d supply, fill #1
  Filled 2022-06-11: qty 30, 30d supply, fill #2

## 2021-10-16 MED ORDER — ATORVASTATIN CALCIUM 40 MG PO TABS: 40.0000 mg | ORAL_TABLET | Freq: Every day | ORAL | 11 refills | Status: DC

## 2021-10-16 NOTE — Patient Instructions (Signed)
Ms.Laylynn Khaniyah Bezek, it was a pleasure seeing you today! ? ?Today we discussed: ?Numbness in right hand and arm ?Please complete orange card paperwork so we can get imaging to see what might be going on in your neck.  ? ?Cholesterol ?I am sending in atorvastatin, please take this everyday for this to be effective. We will recheck blood work in 1 month to see what your cholesterol is. I sent in refills of the atorvastatin. ? ?Pre-diabetes ?We will recheck blood work in 1 month. Your blood sugar was elevated when we last checked it. ? ?Elevated labs ?I want to recheck bloodwork when you follow-u pin 1 month. Please follow-up. ? ?I have ordered the following labs today: ? ?Lab Orders  ?No laboratory test(s) ordered today  ?  ? ?I will call if any are abnormal. All of your labs can be accessed through "My Chart" ?  ?My Chart Access: ?[email or web address] ? ? ?Referrals ordered today:  ? ?Referral Orders  ?No referral(s) requested today  ?  ? ?I have ordered the following medication/changed the following medications:  ? ?Stop the following medications: ?There are no discontinued medications.  ? ?Start the following medications: ?No orders of the defined types were placed in this encounter. ?  ? ?Follow-up:  1 month   ? ?Please make sure to arrive 15 minutes prior to your next appointment. If you arrive late, you may be asked to reschedule.  ? ?We look forward to seeing you next time. Please call our clinic at [phone] if you have any questions or concerns. The best time to call is Monday-Friday from 9am-4pm, but there is someone available 24/7. If after hours or the weekend, call the main hospital number and ask for the Internal Medicine Resident On-Call. If you need medication refills, please notify your pharmacy one week in advance and they will send [place] a request. ? ?Thank you for letting us take part in your care. Wishing you the best! ? ?Thank you, ?Dr. Howie Ill ?[place]  ?

## 2021-10-16 NOTE — Progress Notes (Signed)
? ? ?Subjective:  ?CC: numbness of right arm and hand ? ?HPI: ? ?[name] is a 46 y.o. female with a past medical history of hyperlipidemia, pre-diabetes, cervical radiculopathy who presents today for numbness of right arm and hand. Please see problem based assessment and plan for additional details. ? ?Past Medical History:  ?Diagnosis Date  ? Gestational diabetes   ? ? ?No current outpatient medications on file prior to visit.  ? ?No current facility-administered medications on file prior to visit.  ? ? ?Family History  ?Problem Relation Age of Onset  ? Diabetes Father   ? Hypertension Sister   ? Diabetes Sister   ? ? ?Social History  ? ?Socioeconomic History  ? Marital status: Single  ?  Spouse name: Not on file  ? Number of children: 3  ? Years of education: Not on file  ? Highest education level: Bachelor's degree (e.g., BA, AB, BS)  ?Occupational History  ? Not on file  ?Tobacco Use  ? Smoking status: Never  ? Smokeless tobacco: Never  ?Vaping Use  ? Vaping Use: Never used  ?Substance and Sexual Activity  ? Alcohol use: No  ? Drug use: No  ? Sexual activity: Yes  ?  Birth control/protection: Condom, Post-menopausal  ?Other Topics Concern  ? Not on file  ?Social History Narrative  ? Marital status: single; living with boyfriend; from [place]; moved to [place] 2001.  ?     Children:  3 children; no grandchildren.  ?     Lives: with boyfriend, 3 children.  ? Employment: works at [employer]; makes sandwiches and salads  ? ?Social Determinants of Health  ? ?Financial Resource Strain: Not on file  ?Food Insecurity: No Food Insecurity  ? Worried About Charity fundraiser in the Last Year: Never true  ? Ran Out of Food in the Last Year: Never true  ?Transportation Needs: No Transportation Needs  ? Lack of Transportation (Medical): No  ? Lack of Transportation (Non-Medical): No  ?Physical Activity: Not on file  ?Stress: Not on file  ?Social Connections: Not on file  ?Intimate Partner Violence: Not on file   ? ? ?Review of Systems: ?ROS negative except for what is noted on the assessment and plan. ? ?Objective:  ? ?Vitals:  ? 10/16/21 1344  ?BP: 114/64  ?Pulse: 73  ?Temp: 98.6 ?F (37 ?C)  ?SpO2: 98%  ?Weight: 162 lb (73.5 kg)  ?Height: '5\' 5"'$  (1.651 m)  ? ? ?Physical Exam: ?Gen: A&O x3 and in no apparent distress, well appearing and nourished.u ?Neck: no masses or nodules, AROM intact. ?CV: RRR, no murmurs, S1/S2 presents  ?Resp: Clear to auscultation bilaterally  ?Abd: BS (+) x4, soft, non-tender abdomen, without hepatosplenomegaly or masses ?MSK: no tenderness to palpation over cervical vertebrae or transverse processes, no tenderness over paraspinal muscles.  No radiation of symptoms with passive flexion of neck.  Muscle strength is 5 out of 5 and elbow flexion and extension bilaterally, grip strength 5 out of 5 bilaterally.  No atrophy of arm or hand muscles.  Decreased sensation to light touch on third, fourth, and fifth fingers of right hand.  Medial portion of forearm with decreased sensation to light touch. ?Skin: good skin turgor, no rashes, unusual bruising, or prominent lesions.  ?Neuro: No focal deficits, grossly normal sensation and coordination.  ?Psych: Oriented x3 and responding appropriately. Intact memory, normal mood, judgement, affect, and insight.  ? ? ?Assessment & Plan:  ?Cervical radiculopathy ?[name] is  a 46 year old presenting with numbness and tingling in her right arm.  She states that numbness is present and middle fourth and fifth fingers of right hand and travels up the arm into back of shoulder and neck.  She denies pain with neck movement.  Numbness is worst whenever she wakes up in the mornings and improves throughout the day with activity.  She has not noticed anything that worsens symptoms.  She originally presented with this problem in May 2022.  She states that it is about the same as at that time.  She is concerned about what is causing numbness. ? ?On exam there is no  tenderness to palpation over cervical vertebrae or transverse processes, no tenderness over paraspinal muscles.  No radiation of symptoms with passive flexion of neck.  Muscle strength is 5 out of 5 and elbow flexion and extension bilaterally, grip strength 5 out of 5 bilaterally.  No atrophy of arm or hand muscles.  Decreased sensation to light touch on third, fourth, and fifth fingers of right hand.  Medial portion of forearm with decreased sensation to light touch. ?A: ?Distribution of numbness appears to be in median nerve distribution.  Symptoms are consistent with cervical radiculopathy. Muscle strength is intact and there is not atrophy of muscles in nerve distribution. This is reassuring that imaging can be delayed at this time to allow patient to apply for orange card. ?P: ?-Consider further evaluation with imaging once orange card if obtained. ? ?Hyperlipidemia ?Lab Results  ?Component Value Date  ? CHOL 273 (H) 11/23/2020  ? HDL 43 11/23/2020  ? LDLCALC 198 (H) 11/23/2020  ? TRIG 172 (H) 11/23/2020  ? CHOLHDL 6.3 (H) 11/23/2020  ?  ?LDL> 190 at last office visit 5/22. She was started on atorvastatin 40 mg at that time.  Patient took medication for 1 month, but discontinued because month supply cost $30 at her pharmacy.  She did not have side effects with statins.  Talked with patient about switching to [facility] outpatient pharmacy.  She is in agreement with going there for reduced cost medication.  She is concerned about repeating blood work for financial reasons. ?A/P: ?Atorvastatin 40 mg sent [facility] with IM program and comments ?Blood work completed in May 2022 was through [employer].  Asked patient to complete orange card paperwork as soon as possible to help offset medical cost.  I will double check if labs at 4-week follow-up will be covered by [facility] program since it will be about a year out from initial labs. ? ?Prediabetes ?HgbA1c of 6.1% in May  2022. ?A/P: ?Repeat HgbA1c at follow-up visit ? ?Elevated alkaline phosphatase level ?Alkaline phosphatase was elevated at 132 in May 2022.  Patient is concerned about completing blood work due to financial reasons.  She was given orange packet paperwork and instructed to complete and return as soon as able. ?-Repeat CMP at follow-up  ? ?Patient discussed with Dr. Dareen Piano ? ? ?Christiana Fuchs, D.O. ?[facility]  PGY-1 ?Pager: [phone]  Phone: [phone] ?Date 10/17/2021  Time 6:13 AM  ? ?

## 2021-10-17 DIAGNOSIS — R748 Abnormal levels of other serum enzymes: Secondary | ICD-10-CM | POA: Insufficient documentation

## 2021-10-17 NOTE — Assessment & Plan Note (Signed)
[name] is a 46 year old presenting with numbness and tingling in her right arm.  She states that numbness is present and middle fourth and fifth fingers of right hand and travels up the arm into back of shoulder and neck.  She denies pain with neck movement.  Numbness is worst whenever she wakes up in the mornings and improves throughout the day with activity.  She has not noticed anything that worsens symptoms.  She originally presented with this problem in May 2022.  She states that it is about the same as at that time.  She is concerned about what is causing numbness. ? ?On exam there is no tenderness to palpation over cervical vertebrae or transverse processes, no tenderness over paraspinal muscles.  No radiation of symptoms with passive flexion of neck.  Muscle strength is 5 out of 5 and elbow flexion and extension bilaterally, grip strength 5 out of 5 bilaterally.  No atrophy of arm or hand muscles.  Decreased sensation to light touch on third, fourth, and fifth fingers of right hand.  Medial portion of forearm with decreased sensation to light touch. ?A: ?Distribution of numbness appears to be in median nerve distribution.  Symptoms are consistent with cervical radiculopathy. Muscle strength is intact and there is not atrophy of muscles in nerve distribution. This is reassuring that imaging can be delayed at this time to allow patient to apply for orange card. ?P: ?-Consider further evaluation with imaging once orange card if obtained. ?

## 2021-10-17 NOTE — Assessment & Plan Note (Signed)
Alkaline phosphatase was elevated at 132 in May 2022.  Patient is concerned about completing blood work due to financial reasons.  She was given orange packet paperwork and instructed to complete and return as soon as able. ?-Repeat CMP at follow-up ?

## 2021-10-17 NOTE — Assessment & Plan Note (Signed)
HgbA1c of 6.1% in May 2022. ?A/P: ?Repeat HgbA1c at follow-up visit ?

## 2021-10-17 NOTE — Assessment & Plan Note (Signed)
Lab Results  ?Component Value Date  ? CHOL 273 (H) 11/23/2020  ? HDL 43 11/23/2020  ? LDLCALC 198 (H) 11/23/2020  ? TRIG 172 (H) 11/23/2020  ? CHOLHDL 6.3 (H) 11/23/2020  ?  ?LDL> 190 at last office visit 5/22. She was started on atorvastatin 40 mg at that time.  Patient took medication for 1 month, but discontinued because month supply cost $30 at her pharmacy.  She did not have side effects with statins.  Talked with patient about switching to [facility] outpatient pharmacy.  She is in agreement with going there for reduced cost medication.  She is concerned about repeating blood work for financial reasons. ?A/P: ?Atorvastatin 40 mg sent to [facility] with IM program and comments ?Blood work completed in May 2022 was through [employer].  Asked patient to complete orange card paperwork as soon as possible to help offset medical cost.  I will double check if labs at 4-week follow-up will be covered by [facility] program since it will be about a year out from initial labs. ?

## 2021-10-18 NOTE — Addendum Note (Signed)
Addended by: Edwyna Perfect on: 10/18/2021 04:25 PM ? ? Modules accepted: Level of Service ? ?

## 2021-10-18 NOTE — Progress Notes (Signed)
Internal Medicine Clinic Attending  Case discussed with Dr. Masters  At the time of the visit.  We reviewed the resident's history and exam and pertinent patient test results.  I agree with the assessment, diagnosis, and plan of care documented in the resident's note.  

## 2021-10-24 ENCOUNTER — Other Ambulatory Visit (HOSPITAL_COMMUNITY): Payer: Self-pay

## 2022-01-08 ENCOUNTER — Inpatient Hospital Stay: Payer: Self-pay | Attending: Obstetrics and Gynecology | Admitting: *Deleted

## 2022-01-08 ENCOUNTER — Other Ambulatory Visit: Payer: Self-pay

## 2022-01-08 VITALS — BP 120/82 | Ht 60.0 in | Wt 159.2 lb

## 2022-01-08 DIAGNOSIS — Z0000 Encounter for general adult medical examination without abnormal findings: Secondary | ICD-10-CM

## 2022-01-08 NOTE — Progress Notes (Signed)
[facility] initial [place], [employer]   Clinical Measurement: There were no vitals filed for this visit. Fasting Labs Drawn Today, will review with patient when they result.   Medical History:  Patient states that she has high cholesterol, does not have high blood pressure and she does not have diabetes.  Medications:  Patient states that she does not take medication to lower cholesterol, blood pressure or blood sugar.  Patient does not take an aspirin a day to help prevent a heart attack or stroke.  Blood pressure, self measurement: Patient states that she does not measure blood pressure from home. She checks her blood pressure N/A. She shares her readings with a health care provider: N/A.   Nutrition: Patient states that on average she eats 1 cups of fruit and 2 cups of vegetables per day. Patient states that she does not eat fish at least 2 times per week. Patient eats less than half servings of whole grains. Patient drinks less than 36 ounces of beverages with added sugar weekly: yes. Patient is currently watching sodium or salt intake: yes. In the past 7 days patient has consumed drinks containing alcohol on 0 days. On a day that patient consumes drinks containing alcohol on average 0 drinks are consumed.      Physical activity:  Patient states that she gets 0 minutes of moderate and 0 minutes of vigorous physical activity each week.  Smoking status:  Patient states that she has has never smoked .   Quality of life:  Over the past 2 weeks patient states that she had little interest or pleasure in doing things: not at all. She has been feeling down, depressed or hopeless:not at all.    Risk reduction and counseling:   Health Coaching: Spoke with patient about the daily recommendation for fruits and vegetables. Spoke in detail with patient about practicing a heart healthy diet to lower cholesterol and improve cardiovascular health. Spoke about decreasing the amount  of fried and fatty foods consumed. Encouraged patient to increase lean proteins and reduce the amount of red meats consumed. Patient has been eating a bowl of cheerios daily with reduced fat milk. Spoke with patient about starting back with daily exercise. Encouraged patient to try and start exercising for at least 20 minutes daily. Also spoke with patient about medication adherence. Patient was unable to get prescription last year for cholesterol medication. Spoke with patient about [facility].   Goals: Patient would like to work on making changes with diet. Patient would like to work on reducing the amount of carbs consumed as well as sodas. Patient will work on making these changes over the next 3 months.   Navigation:  I will notify patient of lab results.  Patient is aware of 2 more health coaching sessions and a follow up.  Time: 20 minutes minutes

## 2022-01-09 LAB — LIPID PANEL
Chol/HDL Ratio: 5.6 ratio — ABNORMAL HIGH (ref 0.0–4.4)
Cholesterol, Total: 236 mg/dL — ABNORMAL HIGH (ref 100–199)
HDL: 42 mg/dL (ref >39)
LDL Chol Calc (NIH): 137 mg/dL — ABNORMAL HIGH (ref 0–99)
Triglycerides: 317 mg/dL — ABNORMAL HIGH (ref 0–149)
VLDL Cholesterol Cal: 57 mg/dL — ABNORMAL HIGH (ref 5–40)

## 2022-01-09 LAB — GLUCOSE, RANDOM: Glucose: 109 mg/dL — ABNORMAL HIGH (ref 70–99)

## 2022-01-09 LAB — HEMOGLOBIN A1C
Est. average glucose Bld gHb Est-mCnc: 128 mg/dL
Hgb A1c MFr Bld: 6.1 % — ABNORMAL HIGH (ref 4.8–5.6)

## 2022-01-15 ENCOUNTER — Telehealth: Payer: Self-pay

## 2022-01-31 ENCOUNTER — Other Ambulatory Visit (HOSPITAL_COMMUNITY): Payer: Self-pay

## 2022-01-31 ENCOUNTER — Other Ambulatory Visit: Payer: Self-pay

## 2022-01-31 ENCOUNTER — Ambulatory Visit (INDEPENDENT_AMBULATORY_CARE_PROVIDER_SITE_OTHER): Payer: Self-pay | Admitting: Student

## 2022-01-31 ENCOUNTER — Encounter: Payer: Self-pay | Admitting: Student

## 2022-01-31 VITALS — BP 126/84 | HR 61 | Temp 98.3°F | Resp 20 | Ht 65.0 in | Wt 162.2 lb

## 2022-01-31 DIAGNOSIS — E782 Mixed hyperlipidemia: Secondary | ICD-10-CM

## 2022-01-31 DIAGNOSIS — R7303 Prediabetes: Secondary | ICD-10-CM

## 2022-01-31 DIAGNOSIS — R748 Abnormal levels of other serum enzymes: Secondary | ICD-10-CM

## 2022-01-31 NOTE — Assessment & Plan Note (Addendum)
A1c was 6.1% in June 2023. Denies any polydipsia, polyphagia, polyuria. Recommended dietary changes such as limiting sodas and sweets and increasing physical activity.   Plan -Recheck A1c in 1 year

## 2022-01-31 NOTE — Assessment & Plan Note (Signed)
Elevated Alkaline phosphatase in May 2022. Pt denies any history of liver disease or bone disorder. No EtOH use. Denies any RUQ pain.   Plan -Repeat CMP when she receives her orange card

## 2022-01-31 NOTE — Progress Notes (Addendum)
   CC: Follow-up Hyperlipidemia  HPI:  [name] is a 46 y.o. female living with a history stated below and presents today for hyperlipidemia follow-up. Please see problem based assessment and plan for additional details.   In-person interpreter was present at this visit.   Past Medical History:  Diagnosis Date   Gestational diabetes     Current Outpatient Medications on File Prior to Visit  Medication Sig Dispense Refill   atorvastatin (LIPITOR) 40 MG tablet Take 1 tablet (40 mg total) by mouth daily. (Patient not taking: Reported on 01/08/2022) 30 tablet 11   No current facility-administered medications on file prior to visit.    Review of Systems: ROS negative except for what is noted on the assessment and plan.  Vitals:   01/31/22 0902  BP: 126/84  Pulse: 61  Resp: 20  Temp: 98.3 F (36.8 C)  TempSrc: Oral  SpO2: 97%  Weight: 162 lb 3.2 oz (73.6 kg)  Height: '5\' 5"'$  (1.651 m)    Physical Exam: Constitutional: well-appearing, in no acute distress HENT: normocephalic atraumatic Neck: supple Cardiovascular: regular rate and rhythm Pulmonary/Chest: normal work of breathing on room air, lungs clear to auscultation bilaterally Abdominal: soft, non-tender, non-distended Neurological: alert & oriented x 3 Skin: warm and dry Psych: normal mood and behavior   Assessment & Plan:   Hyperlipidemia Lipid Panel     Component Value Date/Time   CHOL 236 (H) 01/08/2022 0858   TRIG 317 (H) 01/08/2022 0858   TRIG 307 (H) 10/31/2016 1229   HDL 42 01/08/2022 0858   CHOLHDL 5.6 (H) 01/08/2022 0858   CHOLHDL 5.4 10/21/2014 0853   VLDL 47 (H) 10/21/2014 0853   LDLCALC 137 (H) 01/08/2022 0858   LABVLDL 57 (H) 01/08/2022 0858   LDL 198 on 5/22.  She reports not taking her Lipitor 40 mg for the past 3 months because she did not know where to pick it up.  Clarified and provided the address to the [facility] outpatient pharmacy. Denies any chest pain or SOB.  She did  not complete the application for the orange card per last visit.  Plan -restart Lipitor -Repeat lipid panel in 1 year -Provided orange card application at this visit  Elevated alkaline phosphatase level Elevated Alkaline phosphatase in May 2022. Pt denies any history of liver disease or bone disorder. No EtOH use. Denies any RUQ pain.   Plan -Repeat CMP when she receives her orange card  Prediabetes A1c was 6.1% in June 2023. Denies any polydipsia, polyphagia, polyuria. Recommended dietary changes such as limiting sodas and sweets and increasing physical activity.   Plan -Recheck A1c in 1 year   Patient seen with Dr. Isabella Stalling, D.O. [facility], PGY-1 Phone: [phone] Date 01/31/2022 Time 1:19 PM

## 2022-01-31 NOTE — Patient Instructions (Addendum)
Thank you, [name] for allowing [place] to provide your care today. Today we discussed:  Cholesterol: Please pick up your Lipitor from the [facility]. We will recheck your cholesterol in one year. Address: [address] [address] Pre-diabetes: Please try to cut back on sodas and sweets. We will recheck A1C in one year.  Orange Card: provided application form to complete.     I have ordered the following medication/changed the following medications:   Stop the following medications: There are no discontinued medications.   Start the following medications: No orders of the defined types were placed in this encounter.    Follow up: 6 months     Should you have any questions or concerns please call the internal medicine clinic at [phone].    Angelique Blonder, D.O. [place]

## 2022-01-31 NOTE — Assessment & Plan Note (Addendum)
Lipid Panel     Component Value Date/Time   CHOL 236 (H) 01/08/2022 0858   TRIG 317 (H) 01/08/2022 0858   TRIG 307 (H) 10/31/2016 1229   HDL 42 01/08/2022 0858   CHOLHDL 5.6 (H) 01/08/2022 0858   CHOLHDL 5.4 10/21/2014 0853   VLDL 47 (H) 10/21/2014 0853   LDLCALC 137 (H) 01/08/2022 0858   LABVLDL 57 (H) 01/08/2022 0858   LDL 198 on 5/22.  She reports not taking her Lipitor 40 mg for the past 3 months because she did not know where to pick it up.  Clarified and provided the address to the [facility] outpatient pharmacy. Denies any chest pain or SOB.  She did not complete the application for the orange card per last visit.  Plan -restart Lipitor -Repeat lipid panel in 1 year -Provided orange card application at this visit

## 2022-02-01 NOTE — Progress Notes (Signed)
Internal Medicine Clinic Attending  I saw and evaluated the patient.  I personally confirmed the key portions of the history and exam documented by Dr. Zheng and I reviewed pertinent patient test results.  The assessment, diagnosis, and plan were formulated together and I agree with the documentation in the resident's note.  

## 2022-04-04 ENCOUNTER — Other Ambulatory Visit (HOSPITAL_COMMUNITY): Payer: Self-pay

## 2022-04-16 ENCOUNTER — Telehealth: Payer: Self-pay

## 2022-04-16 NOTE — Telephone Encounter (Signed)
Health Coaching 3  interpreter- Rudene Anda, [facility]   Goals- Patient has been working on reaching goals. Patient has been exercising 2-3 days a week. Patient has been trying to eat healthier foods.    New goal- Increase exercising to 4-5 times per day. Patient still working on making changes with diet in regards to watching the foods that she knows she should avoid.  Barrier to reaching goal-    Strategies to overcome-    Navigation:  Patient is aware of  a follow up session.   Time- 10 minutes

## 2022-06-11 ENCOUNTER — Other Ambulatory Visit (HOSPITAL_COMMUNITY): Payer: Self-pay

## 2022-10-01 ENCOUNTER — Other Ambulatory Visit: Payer: Self-pay | Admitting: Obstetrics and Gynecology

## 2022-10-01 DIAGNOSIS — Z1231 Encounter for screening mammogram for malignant neoplasm of breast: Secondary | ICD-10-CM

## 2022-10-23 ENCOUNTER — Ambulatory Visit
Admission: RE | Admit: 2022-10-23 | Discharge: 2022-10-23 | Disposition: A | Discharge status: Home / Self Care | Payer: No Typology Code available for payment source | Source: Ambulatory Visit | Attending: Obstetrics and Gynecology | Admitting: Obstetrics and Gynecology

## 2022-10-23 ENCOUNTER — Ambulatory Visit: Payer: Self-pay | Admitting: Hematology and Oncology

## 2022-10-23 VITALS — BP 122/87 | Wt 166.0 lb

## 2022-10-23 DIAGNOSIS — Z1231 Encounter for screening mammogram for malignant neoplasm of breast: Secondary | ICD-10-CM

## 2022-10-23 DIAGNOSIS — Z1211 Encounter for screening for malignant neoplasm of colon: Secondary | ICD-10-CM

## 2022-10-23 NOTE — Progress Notes (Signed)
Jackie Abbott is a 47 y.o. female who presents to [facility] clinic today with no complaints.    Pap Smear: Pap not smear completed today. Last Pap smear was 2022 and was normal. Per patient has no history of an abnormal Pap smear. Last Pap smear result is available in Epic.   Physical exam: Breasts Breasts symmetrical. No skin abnormalities bilateral breasts. No nipple retraction bilateral breasts. No nipple discharge bilateral breasts. No lymphadenopathy. No lumps palpated bilateral breasts.  MS DIGITAL SCREENING TOMO BILATERAL  Result Date: 07/26/2021 CLINICAL DATA:  Screening. EXAM: DIGITAL SCREENING BILATERAL MAMMOGRAM WITH TOMOSYNTHESIS AND CAD TECHNIQUE: Bilateral screening digital craniocaudal and mediolateral oblique mammograms were obtained. Bilateral screening digital breast tomosynthesis was performed. The images were evaluated with computer-aided detection. COMPARISON:  Previous exam(s). ACR Breast Density Category b: There are scattered areas of fibroglandular density. FINDINGS: There are no findings suspicious for malignancy. IMPRESSION: No mammographic evidence of malignancy. A result letter of this screening mammogram will be mailed directly to the patient. RECOMMENDATION: Screening mammogram in one year. (Code:SM-B-01Y) BI-RADS CATEGORY  1: Negative. Electronically Signed   By: Everlean Alstrom M.D.   On: 07/26/2021 08:05   MS DIGITAL SCREENING TOMO BILATERAL  Result Date: 07/14/2020 CLINICAL DATA:  Screening. EXAM: DIGITAL SCREENING BILATERAL MAMMOGRAM WITH TOMO AND CAD COMPARISON:  Previous exam(s). ACR Breast Density Category b: There are scattered areas of fibroglandular density. FINDINGS: There are no findings suspicious for malignancy. Images were processed with CAD. IMPRESSION: No mammographic evidence of malignancy. A result letter of this screening mammogram will be mailed directly to the patient. RECOMMENDATION: Screening mammogram in one year. (Code:SM-B-01Y) BI-RADS  CATEGORY  1: Negative. Electronically Signed   By: Claudie Revering M.D.   On: 07/14/2020 11:57   MS DIGITAL SCREENING TOMO BILATERAL  Result Date: 04/14/2019 CLINICAL DATA:  Screening. EXAM: DIGITAL SCREENING BILATERAL MAMMOGRAM WITH TOMO AND CAD COMPARISON:  Previous exam(s). ACR Breast Density Category b: There are scattered areas of fibroglandular density. FINDINGS: There are no findings suspicious for malignancy. Images were processed with CAD. IMPRESSION: No mammographic evidence of malignancy. A result letter of this screening mammogram will be mailed directly to the patient. RECOMMENDATION: Screening mammogram in one year. (Code:SM-B-01Y) BI-RADS CATEGORY  1: Negative. Electronically Signed   By: Abelardo Diesel M.D.   On: 04/14/2019 16:08   MS DIGITAL SCREENING TOMO BILATERAL  Result Date: 12/11/2017 CLINICAL DATA:  Screening. EXAM: DIGITAL SCREENING BILATERAL MAMMOGRAM WITH TOMO AND CAD COMPARISON:  Previous exam(s). ACR Breast Density Category b: There are scattered areas of fibroglandular density. FINDINGS: There are no findings suspicious for malignancy. Images were processed with CAD. IMPRESSION: No mammographic evidence of malignancy. A result letter of this screening mammogram will be mailed directly to the patient. RECOMMENDATION: Screening mammogram in one year. (Code:SM-B-01Y) BI-RADS CATEGORY  1: Negative. Electronically Signed   By: Marin Olp M.D.   On: 12/11/2017 07:59         Pelvic/Bimanual Pap is not indicated today    Smoking History: Patient has never smoked and was not referred to quit line.    Patient Navigation: Patient education provided. Access to services provided for patient through [place] interpreter provided. No transportation provided   Colorectal Cancer Screening: Per patient has never had colonoscopy completed No complaints today. FIT test given.    Breast and Cervical Cancer Risk Assessment: Patient does not have family history of  breast cancer, known genetic mutations, or radiation treatment to the chest before  age 50. Patient does not have history of cervical dysplasia, immunocompromised, or DES exposure in-utero.  Risk Assessment   No risk assessment data for the current encounter  Risk Scores       07/25/2021   Last edited by: Demetrius Revel, LPN   5-year risk: 0.7 %   Lifetime risk: 7.3 %            A: [facility] exam without pap smear No complaints with benign exam.   P: Referred patient to the [facility] for a screening mammogram. Appointment scheduled 10/23/2022.  Melodye Ped, NP 10/23/2022 10:31 AM

## 2022-10-23 NOTE — Patient Instructions (Signed)
Taught Lexus Surenity Shaya about self breast awareness and gave educational materials to take home. Patient did not need a Pap smear today due to last Pap smear was in 2022 per patient. Let her know [facility] will cover Pap smears every 5 years unless has a history of abnormal Pap smears. Referred patient to the [facility] for screening mammogram. Appointment scheduled for 10/23/2022. Patient aware of appointment and will be there. Let patient know will follow up with her within the next couple weeks with results. Artelia Iratze Curler verbalized understanding.  Melodye Ped, NP 10:32 AM

## 2022-10-29 ENCOUNTER — Other Ambulatory Visit: Payer: Self-pay | Admitting: Internal Medicine

## 2022-10-29 ENCOUNTER — Other Ambulatory Visit (HOSPITAL_COMMUNITY): Payer: Self-pay

## 2022-10-29 DIAGNOSIS — E782 Mixed hyperlipidemia: Secondary | ICD-10-CM

## 2022-10-29 MED ORDER — ATORVASTATIN CALCIUM 40 MG PO TABS
40.0000 mg | ORAL_TABLET | Freq: Every day | ORAL | 11 refills | Status: DC
  Filled 2022-10-29: qty 30, 30d supply, fill #0

## 2022-11-03 LAB — FECAL OCCULT BLOOD, IMMUNOCHEMICAL: Fecal Occult Bld: NEGATIVE

## 2022-11-15 ENCOUNTER — Other Ambulatory Visit (HOSPITAL_COMMUNITY): Payer: Self-pay

## 2022-12-25 ENCOUNTER — Encounter: Payer: Self-pay | Admitting: *Deleted

## 2023-01-16 ENCOUNTER — Inpatient Hospital Stay: Payer: No Typology Code available for payment source | Attending: Obstetrics and Gynecology | Admitting: *Deleted

## 2023-01-16 ENCOUNTER — Other Ambulatory Visit: Payer: Self-pay

## 2023-01-16 VITALS — BP 122/82 | Ht 60.0 in | Wt 166.0 lb

## 2023-01-16 DIAGNOSIS — Z0000 Encounter for general adult medical examination without abnormal findings: Secondary | ICD-10-CM

## 2023-01-16 NOTE — Progress Notes (Signed)
[facility] initial screening   Interpreter- Natale Lay, [employer]   Clinical Measurement: There were no vitals filed for this visit. Fasting Labs Drawn Today, will review with patient when they result.   Medical History: Patient states that she has high cholesterol, does not have high blood pressure and she does not have diabetes.  Medications: Patient states that she does not take medication to lower cholesterol, blood pressure or blood sugar.  Patient does not take an aspirin a day to help prevent a heart attack or stroke.    Blood pressure, self measurement: Patient states that she does measure blood pressure from home. She checks her blood pressure N/A. She shares her readings with a health care provider: N/A.   Nutrition: Patient states that on average she eats 0 cups of fruit and 0 cups of vegetables per day. Patient states that she does not eat fish at least 2 times per week. Patient eats less than half servings of whole grains. Patient drinks less than 36 ounces of beverages with added sugar weekly: yes. Patient is currently watching sodium or salt intake: no. In the past 7 days patient has consumed drinks containing alcohol on 0 days. On a day that patient consumes drinks containing alcohol on average 0 drinks are consumed.      Physical activity: Patient states that she gets 0 minutes of moderate and 0 minutes of vigorous physical activity each week.  Smoking status: Patient states that she has has never smoked .   Quality of life: Over the past 2 weeks patient states that she had little interest or pleasure in doing things: not at all. She has been feeling down, depressed or hopeless:not at all.   Social Determinants of Health Assessment:   Computer Use: During the last 12 months patient states that she has used any of the following: desktop/laptop, smart phone or tablet/other portable wireless computer: yes.   Internet Use: During the last 12 months, did you or any member of your  household have access to the internet: Yes, by paying a cell phone company or internet service provider.   Food Insecurities: During the last 12 months, where there any times when you were worried that you would run out of food because of a lack of money or other resources: No.   Transportation Barriers: During the last 12 months, have you missed a doctor's appointment because of transportation problems: No.   Childcare Barriers: If you are currently using childcare services, please identify  the type of services you use. (If not using childcare services, please select "Not applicable"): not applicable. During the last 12 months, have you had any barriers to childcare services such as: not applicable.   Housing: What is your housing situation today: I have housing.   Intimate Partner Violence: During the last 12 months, how often did your partner physically hurt you: never. During the last 12 months, how often did your partner insult you or talk down to you: never.  Medication Adherence: During the last 12 months, did you ever forget to take your medicine: Yes. During the last 12 months, were you careless ar times about taking your medicine: Yes. During the last 12 months, when you felt better did you sometimes stop taking your medication: No. During the last 12 months, sometimes if you felt worse when you took your medicine did you stop taking it: No.   Risk reduction and counseling:   Health Coaching: Spoke in detail with patient about a heart healthy diet.  Showed patient using food models what serving sizes would look like. Went through the [employer] and spoke with the patient about the different food groups she should be eating from. Patient has not been exercising recently but would like to start. See exercise goal below.  Goal: Patient would like to start walking 2 days a week for 20-30 minutes. Patient will work on reaching this goal over the next month. Patient will increase  exercise time once goal has been reached.   Navigation:  I will notify patient of lab results.  Patient is aware of 2 more health coaching sessions and a follow up.  Time: 25 minutes

## 2023-01-17 LAB — LIPID PANEL
Chol/HDL Ratio: 5.9 ratio — ABNORMAL HIGH (ref 0.0–4.4)
Cholesterol, Total: 236 mg/dL — ABNORMAL HIGH (ref 100–199)
HDL: 40 mg/dL (ref >39)
LDL Chol Calc (NIH): 166 mg/dL — ABNORMAL HIGH (ref 0–99)
Triglycerides: 163 mg/dL — ABNORMAL HIGH (ref 0–149)
VLDL Cholesterol Cal: 30 mg/dL (ref 5–40)

## 2023-01-17 LAB — GLUCOSE, RANDOM: Glucose: 123 mg/dL — ABNORMAL HIGH (ref 70–99)

## 2023-01-17 LAB — HEMOGLOBIN A1C
Est. average glucose Bld gHb Est-mCnc: 140 mg/dL
Hgb A1c MFr Bld: 6.5 % — ABNORMAL HIGH (ref 4.8–5.6)

## 2023-01-23 ENCOUNTER — Telehealth: Payer: Self-pay

## 2023-01-23 NOTE — Telephone Encounter (Signed)
Health coaching 2   interpreter- Natale Lay, [employer]   Labs- 236 cholesterol, 166 LDL cholesterol, 163 triglycerides, 40 HDL cholesterol, 6.5 hemoglobin A1C, 123 mean plasma glucose. Patient understands and is aware of her lab results.   Goals-  1. Practice heart healthy diet. Reduce the amount of fried and fatty foods consumed. Try to grill, bake, broil or sautee foods instead. Reduce the amount of red meats consumed. Substitute for lean proteins like chicken or fish. Increase daily intake of fruits, vegetables and whole grains. 2. Reduce the amount of sweet and sugary foods and drinks consumed. 3. Reduce the amount of carbs consumed. 4. Daily exercise for 20-30 minutes.      Navigation:  Patient is aware of 1 more health coaching sessions and a follow up. Will refer patient to Internal Medicine for follow-up for elevated labs.   Time- 12 minutes

## 2023-02-11 ENCOUNTER — Other Ambulatory Visit: Payer: Self-pay | Admitting: *Deleted

## 2023-02-11 ENCOUNTER — Encounter: Payer: Self-pay | Admitting: Student

## 2023-02-11 ENCOUNTER — Ambulatory Visit (INDEPENDENT_AMBULATORY_CARE_PROVIDER_SITE_OTHER): Payer: Self-pay | Admitting: Student

## 2023-02-11 ENCOUNTER — Other Ambulatory Visit (HOSPITAL_COMMUNITY): Payer: Self-pay

## 2023-02-11 VITALS — BP 119/77 | HR 99 | Temp 98.4°F | Wt 167.6 lb

## 2023-02-11 DIAGNOSIS — Z6832 Body mass index (BMI) 32.0-32.9, adult: Secondary | ICD-10-CM

## 2023-02-11 DIAGNOSIS — E782 Mixed hyperlipidemia: Secondary | ICD-10-CM

## 2023-02-11 DIAGNOSIS — R7303 Prediabetes: Secondary | ICD-10-CM

## 2023-02-11 DIAGNOSIS — E66811 Obesity, class 1: Secondary | ICD-10-CM

## 2023-02-11 DIAGNOSIS — E785 Hyperlipidemia, unspecified: Secondary | ICD-10-CM

## 2023-02-11 DIAGNOSIS — E669 Obesity, unspecified: Secondary | ICD-10-CM

## 2023-02-11 LAB — POCT GLYCOSYLATED HEMOGLOBIN (HGB A1C): Hemoglobin A1C: 6.4 % — AB (ref 4.0–5.6)

## 2023-02-11 LAB — GLUCOSE, CAPILLARY: Glucose-Capillary: 100 mg/dL — ABNORMAL HIGH (ref 70–99)

## 2023-02-11 MED ORDER — ATORVASTATIN CALCIUM 40 MG PO TABS
40.0000 mg | ORAL_TABLET | Freq: Every day | ORAL | 11 refills | Status: DC
  Filled 2023-02-11: qty 30, 30d supply, fill #0
  Filled 2023-04-02: qty 30, 30d supply, fill #1
  Filled 2023-05-28: qty 30, 30d supply, fill #2
  Filled 2023-07-29: qty 30, 30d supply, fill #3
  Filled 2023-09-26: qty 30, 30d supply, fill #4
  Filled 2024-02-03: qty 30, 30d supply, fill #0

## 2023-02-11 NOTE — Assessment & Plan Note (Addendum)
LDL remains elevated, last at 166. States not on atorvastatin 40 mg for at least 2 months, dispense record appears several months. States unable to obtain it from pharmacy per patient. Denies any side effects while on it. Refilled Rx and discussed medication adherence today.   Plan -Refilled Lipitor -Recheck lipid panel at future visit

## 2023-02-11 NOTE — Patient Instructions (Addendum)
Jackie Abbott, Jackie Abbott por permitirnos brindarle su atencin hoy. Hoy discutimos:   -Resurt tus recetas de Atorvastatina para tu colesterol.  -Hoy revisaremos anlisis de sangre sobre su prediabetes. -Seguimiento: 4-6 meses  ------------------------- Thank you, [name] for allowing [place] to provide your care today. Today we discussed:   -I refilled your prescriptions for Atorvastatin for your cholesterol.  -Will check blood work today about your pre-diabetes.   I have ordered the following labs for you:  Lab Orders         POC Hbg A1C      I have ordered the following medication/changed the following medications:   Stop the following medications: Medications Discontinued During This Encounter  Medication Reason   atorvastatin (LIPITOR) 40 MG tablet Reorder     Start the following medications: Meds ordered this encounter  Medications   atorvastatin (LIPITOR) 40 MG tablet    Sig: Take 1 tablet (40 mg total) by mouth daily.    Dispense:  30 tablet    Refill:  11    IM program     Follow up:  4-6 months    Should you have any questions or concerns please call the internal medicine clinic at [phone].    Jackie Abbott, D.O. [facility]

## 2023-02-11 NOTE — Assessment & Plan Note (Signed)
BMI 32.73. Patient working on [employer] habits and staying active at home. Encouraged and continue lifestyle modifications.

## 2023-02-11 NOTE — Progress Notes (Signed)
   CC: wise women labs f/u  HPI:  [name] is a 47 y.o. female living with a history stated below and presents today for wise women labs f/u. Please see problem based assessment and plan for additional details.  Past Medical History:  Diagnosis Date   Gestational diabetes     No current outpatient medications on file prior to visit.   No current facility-administered medications on file prior to visit.   Review of Systems: ROS negative except for what is noted on the assessment and plan.  Vitals:   02/11/23 0852  BP: 119/77  Pulse: 99  Temp: 98.4 F (36.9 C)  TempSrc: Oral  SpO2: 99%  Weight: 167 lb 9.6 oz (76 kg)   Physical Exam: Constitutional: well-appearing female sitting in chair comfortably, in no acute distress Cardiovascular: regular rate and rhythm, 2+ dorsalis pedis pulse bilaterally Pulmonary/Chest: normal work of breathing on room air Skin: no open wounds or cuts over feet  Psych: pleasant mood  Assessment & Plan:   Hyperlipidemia LDL remains elevated, last at 166. States not on atorvastatin 40 mg for at least 2 months, dispense record appears several months. States unable to obtain it from pharmacy per patient. Denies any side effects while on it. Refilled Rx and discussed medication adherence today.   Plan -Refilled Lipitor -Recheck lipid panel at future visit   Prediabetes Screening A1c 6.5% in June which is in diabetes range but glucose 123. Prior was steady around 6.1%. Repeat today was 6.4%. Discussed lifestyle modifications which patient has been trying the past couple months, encouraged to continue. Declined starting any medication today.   Plan -Continue lifestyle modifications -Repeat A1c in 6 months to reassess  Obesity (BMI 30.0-34.9) BMI 32.73. Patient working on [employer] habits and staying active at home. Encouraged and continue lifestyle modifications.    Patient discussed with Dr. Docia Furl,  D.O. [facility], PGY-2 Phone: [phone] Date 02/11/2023 Time 12:30 PM

## 2023-02-11 NOTE — Assessment & Plan Note (Addendum)
Screening A1c 6.5% in June which is in diabetes range but glucose 123. Prior was steady around 6.1%. Repeat today was 6.4%. Discussed lifestyle modifications which patient has been trying the past couple months, encouraged to continue. Declined starting any medication today.   Plan -Continue lifestyle modifications -Repeat A1c in 6 months to reassess

## 2023-02-21 NOTE — Progress Notes (Signed)
Internal Medicine Clinic Attending  Case discussed with the resident at the time of the visit.  We reviewed the resident's history and exam and pertinent patient test results.  I agree with the assessment, diagnosis, and plan of care documented in the resident's note.  

## 2023-04-02 ENCOUNTER — Other Ambulatory Visit (HOSPITAL_COMMUNITY): Payer: Self-pay

## 2023-05-28 ENCOUNTER — Other Ambulatory Visit (HOSPITAL_COMMUNITY): Payer: Self-pay

## 2023-07-29 ENCOUNTER — Other Ambulatory Visit (HOSPITAL_COMMUNITY): Payer: Self-pay

## 2023-09-26 ENCOUNTER — Other Ambulatory Visit (HOSPITAL_COMMUNITY): Payer: Self-pay

## 2023-10-29 ENCOUNTER — Telehealth: Payer: Self-pay

## 2023-10-29 NOTE — Telephone Encounter (Signed)
  Health Coaching 3   interpreter- Natale Lay, [employer]   Goals- Patient's PCP prescribed lipitor again for cholesterol. Per patient she was unable to pick up her prescription in the past due to prescription cost. Patient has been enrolled with medication assistance program to help with Rx cost. Patient states that she has been trying to eat healthier. Encouraged patient to continue to try and add exercise into her daily routine. Explained to patient that the goal would be for 20-30 minutes daily.      Navigation:  Patient is aware of  a follow up session. FU scheduled for 12/04/23

## 2023-12-04 ENCOUNTER — Inpatient Hospital Stay: Attending: Obstetrics and Gynecology | Admitting: *Deleted

## 2023-12-04 ENCOUNTER — Other Ambulatory Visit: Payer: Self-pay

## 2023-12-04 VITALS — BP 126/85 | Ht 60.0 in | Wt 162.2 lb

## 2023-12-04 DIAGNOSIS — Z0000 Encounter for general adult medical examination without abnormal findings: Secondary | ICD-10-CM

## 2023-12-04 DIAGNOSIS — Z1231 Encounter for screening mammogram for malignant neoplasm of breast: Secondary | ICD-10-CM

## 2023-12-04 NOTE — Progress Notes (Signed)
 [facility]woman follow up   Interpreter: Mallissa Seals, Lenis Quin  Clinical Measurement:   Vitals:   12/04/23 0833 12/04/23 0853  BP: 115/79 126/85      Medical History: Patient states that she has high cholesterol, does not have high blood pressure and she does not have diabetes. Patient states that she does not have history of gestational hypertension, does not have history of pre-eclampsia/eclampsia and she has history of gestational diabetes.     Medications: Patient states that she does take medication to lower cholesterol, blood pressure and blood sugar.  Patient does not take an aspirin a day to help prevent a heart attack or stroke. During the past 7 days patient has taken prescribed medication to lower cholesterol on 7 days.   Blood pressure, self measurement: Patient states that she does not measure blood pressure from home. She checks her blood pressure N/A. She shares her readings with a health care provider: N/A.   Nutrition: Patient states that on average she eats 1 cups of fruit and 1 cups of vegetables per day. Patient states that she does not eat fish at least 2 times per week. Patient eats less than half servings of whole grains. Patient drinks less than 36 ounces of beverages with added sugar weekly: yes. Patient is currently watching sodium or salt intake: no. In the past 7 days patient has had 0 drinks containing alcohol. On average patient drinks 0 drinks containing alcohol per day.      Physical activity: Patient states that she gets 60 minutes of moderate and 0 minutes of vigorous physical activity each week.  Smoking status: Patient states that she has has never smoked .   Quality of life: Over the past 2 weeks patient states that she had little interest or pleasure in doing things: not at all. She has been feeling down, depressed or hopeless:not at all.   Social Determinants of Health Assessment:   Computer Use: During the last 12 months patient states that she has used any  of the following: desktop/laptop, smart phone or tablet/other portable wireless computer: yes.   Internet Use: During the last 12 months, did you or any member of your household have access to the internet: Yes, by paying a cell phone company or internet service provider.   Food Insecurities: During the last 12 months, where there any times when you were worried that you would run out of food because of a lack of money or other resources: No.   Transportation Barriers: During the last 12 months, have you missed a doctor's appointment because of transportation problems: No.   Childcare Barriers: If you are currently using childcare services, please identify  the type of services you use. (If not using childcare services, please select "Not applicable"): not applicable. During the last 12 months, have you had any barriers to childcare services such as: not applicable.   Housing: What is your housing situation today: I have housing.   Intimate Partner Violence: During the last 12 months, how often did your partner physically hurt you: never. During the last 12 months, how often did your partner insult you or talk down to you: never.  Medication Adherence: During the last 12 months, did you ever forget to take your medicine: No. During the last 12 months, were you careless ar times about taking your medicine: No. During the last 12 months, when you felt better did you sometimes stop taking your medication: No. During the last 12 months, sometimes if you felt worse  when you took your medicine did you stop taking it: No.    Risk reduction and counseling:   Health Coaching: Spoke with patient about the daily recommendations for fruits and vegetables (2 cups fruit and 3 cups vegetables). Patient does not consume fish. Explained how fish can be part of a heart healthy diet and can help with lowering cholesterol. Patient consumes whole wheat bread and at times oatmeal. Gave examples of other whole grains  that she can try adding into diet (whole grain cereals, brown rice or whole wheat pasta). Patient has not been watching sodium intake. Encouraged her to try and watch the amount of salt that she cooks with and to not add extra salt after preparing food. Patient has been walking 2 days a week for 20-30 minutes. Encouraged patient to try and get 20-30 minutes of exercise daily if possible.   Navigation: This was the  follow up session for this patient, I will check up on her progress in the coming months.

## 2024-01-23 ENCOUNTER — Ambulatory Visit: Payer: Self-pay | Admitting: *Deleted

## 2024-01-23 ENCOUNTER — Ambulatory Visit
Admission: RE | Admit: 2024-01-23 | Discharge: 2024-01-23 | Disposition: A | Discharge status: Home / Self Care | Source: Ambulatory Visit | Attending: Obstetrics and Gynecology | Admitting: Obstetrics and Gynecology

## 2024-01-23 VITALS — BP 118/75 | Wt 165.0 lb

## 2024-01-23 DIAGNOSIS — Z1231 Encounter for screening mammogram for malignant neoplasm of breast: Secondary | ICD-10-CM

## 2024-01-23 DIAGNOSIS — Z1239 Encounter for other screening for malignant neoplasm of breast: Secondary | ICD-10-CM

## 2024-01-23 DIAGNOSIS — Z1211 Encounter for screening for malignant neoplasm of colon: Secondary | ICD-10-CM

## 2024-01-23 NOTE — Patient Instructions (Signed)
 Explained breast self awareness with Jackie Abbott. Patient did not need a Pap smear today due to last Pap smear and HPV typing was 10/24/2020. Let her know [facility] will cover Pap smears and HPV typing every 5 years unless has a history of abnormal Pap smears. [facility]erred [facility] to the [facility] for a screening mammogram on mobile unit. Appointment scheduled Thursday, January 23, 2024 at 1040. Patient aware of appointment and will be there. Let patient know the Breast Center will follow up with her within the next couple weeks with results of her mammogram by letter or phone. Jackie Abbott verbalized understanding.  Kaelynne Christley, Wanda Ship, RN 10:25 AM

## 2024-01-23 NOTE — Progress Notes (Signed)
 Jackie Abbott is a 48 y.o. female who presents to [facility] clinic today with no complaints.    Pap Smear: Pap smear not completed today. Last Pap smear was 10/24/2020 at the free cervical cancer screening clinic and was normal with negative HPV. Per patient has no history of an abnormal Pap smear. Last Pap smear result is available in Epic.    Physical exam: Breasts Breasts symmetrical. No skin abnormalities bilateral breasts. No nipple retraction bilateral breasts. No nipple discharge bilateral breasts. No lymphadenopathy. No lumps palpated bilateral breasts. No complaints of pain or tenderness on exam.   MS 3D SCR MAMMO BILAT BR (aka MM) Result Date: 10/24/2022 CLINICAL DATA:  Screening. EXAM: DIGITAL SCREENING BILATERAL MAMMOGRAM WITH TOMOSYNTHESIS AND CAD TECHNIQUE: Bilateral screening digital craniocaudal and mediolateral oblique mammograms were obtained. Bilateral screening digital breast tomosynthesis was performed. The images were evaluated with computer-aided detection. COMPARISON:  Previous exam(s). ACR Breast Density Category b: There are scattered areas of fibroglandular density. FINDINGS: There are no findings suspicious for malignancy. IMPRESSION: No mammographic evidence of malignancy. A result letter of this screening mammogram will be mailed directly to the patient. RECOMMENDATION: Screening mammogram in one year. (Code:SM-B-01Y) BI-RADS CATEGORY  1: Negative. Electronically Signed   By: Inocente Ast M.D.   On: 10/24/2022 16:45   MS DIGITAL SCREENING TOMO BILATERAL Result Date: 07/26/2021 CLINICAL DATA:  Screening. EXAM: DIGITAL SCREENING BILATERAL MAMMOGRAM WITH TOMOSYNTHESIS AND CAD TECHNIQUE: Bilateral screening digital craniocaudal and mediolateral oblique mammograms were obtained. Bilateral screening digital breast tomosynthesis was performed. The images were evaluated with computer-aided detection. COMPARISON:  Previous exam(s). ACR Breast Density Category b: There are  scattered areas of fibroglandular density. FINDINGS: There are no findings suspicious for malignancy. IMPRESSION: No mammographic evidence of malignancy. A result letter of this screening mammogram will be mailed directly to the patient. RECOMMENDATION: Screening mammogram in one year. (Code:SM-B-01Y) BI-RADS CATEGORY  1: Negative. Electronically Signed   By: Delon Music M.D.   On: 07/26/2021 08:05   MS DIGITAL SCREENING TOMO BILATERAL Result Date: 07/14/2020 CLINICAL DATA:  Screening. EXAM: DIGITAL SCREENING BILATERAL MAMMOGRAM WITH TOMO AND CAD COMPARISON:  Previous exam(s). ACR Breast Density Category b: There are scattered areas of fibroglandular density. FINDINGS: There are no findings suspicious for malignancy. Images were processed with CAD. IMPRESSION: No mammographic evidence of malignancy. A result letter of this screening mammogram will be mailed directly to the patient. RECOMMENDATION: Screening mammogram in one year. (Code:SM-B-01Y) BI-RADS CATEGORY  1: Negative. Electronically Signed   By: Elspeth Bathe M.D.   On: 07/14/2020 11:57   MS DIGITAL SCREENING TOMO BILATERAL Result Date: 04/14/2019 CLINICAL DATA:  Screening. EXAM: DIGITAL SCREENING BILATERAL MAMMOGRAM WITH TOMO AND CAD COMPARISON:  Previous exam(s). ACR Breast Density Category b: There are scattered areas of fibroglandular density. FINDINGS: There are no findings suspicious for malignancy. Images were processed with CAD. IMPRESSION: No mammographic evidence of malignancy. A result letter of this screening mammogram will be mailed directly to the patient. RECOMMENDATION: Screening mammogram in one year. (Code:SM-B-01Y) BI-RADS CATEGORY  1: Negative. Electronically Signed   By: Craig Farr M.D.   On: 04/14/2019 16:08    Pelvic/Bimanual Pap is not indicated today per BCCCP guidelines.   Smoking History: Patient has never smoked.  Patient Navigation: Patient education provided. Access to services provided for patient through  [facility] program. Spanish interpreter Bernice Angry from [facility] provided.   Colorectal Cancer Screening: Per patient has had colonoscopy completed on 06/10/2012. FIT Test given to patient to  complete. No complaints today.    Breast and Cervical Cancer Risk Assessment: Patient does not have family history of breast cancer, known genetic mutations, or radiation treatment to the chest before age 17. Patient does not have history of cervical dysplasia, immunocompromised, or DES exposure in-utero.  Risk Scores as of Encounter on 01/23/2024     Jackie Abbott           5-year 0.65%   Lifetime 6.59%            Last calculated by Silas, Ansyi K, CMA on 01/23/2024 at 10:10 AM        A: BCCCP exam without pap smear No complaints.  P: Referred patient to the [facility] for a screening mammogram on mobile unit. Appointment scheduled Thursday, January 23, 2024 at 1040.   Driscilla Wanda SQUIBB, RN 01/23/2024 10:24 AM

## 2024-01-27 ENCOUNTER — Other Ambulatory Visit

## 2024-01-27 ENCOUNTER — Ambulatory Visit

## 2024-01-30 NOTE — Progress Notes (Signed)
 [facility] Re-screening   Interpreter- Bernice Angry, [place]   Clinical Measurement:  Vitals:   02/03/24 1012 02/03/24 1234  BP: 135/85 135/81   Fasting Labs Drawn Today, will review with patient when they result.   Medical History: Patient states that she has high cholesterol, does not have high blood pressure and she does not have diabetes. Patient states that she does not have history of gestational hypertension, does not have history of pre-eclampsia/eclampsia and she has history of gestational diabetes.    Medications: Patient states that she does take medication to lower cholesterol, blood pressure or blood sugar.  Patient does not take an aspirin a day to help prevent a heart attack or stroke. During the past 7 days patient has taken prescribed medication to lower cholesterol on 5 days.   Blood pressure, self measurement: Patient states that she does not measure blood pressure from home. She checks her blood pressure N/A. She shares her readings with a health care provider: N/A.   Nutrition: Patient states that on average she eats 1 cups of fruit and 1 cups of vegetables per day. Patient states that she does not eat fish at least 2 times per week. Patient eats less than half servings of whole grains. Patient drinks less than 36 ounces of beverages with added sugar weekly: yes. Patient is currently watching sodium or salt intake: no. In the past 7 days patient has consumed drinks containing alcohol on 0 days. On a day that patient consumes drinks containing alcohol on average 0 drinks are consumed.      Physical activity: Patient states that she gets 40 minutes of moderate and 0 minutes of vigorous physical activity each week.  Smoking status: Patient states that she has has never smoked .   Quality of life: Over the past 2 weeks patient states that she had little interest or pleasure in doing things: nearly everyday. She has been feeling down, depressed or hopeless:nearly everyday.    Social Determinants of Health Assessment:   Computer Use: During the last 12 months patient states that she has used any of the following: desktop/laptop, smart phone or tablet/other portable wireless computer: yes.   Internet Use: During the last 12 months, did you or any member of your household have access to the internet: Yes, by paying a cell phone company or internet service provider.   Food Insecurities: During the last 12 months, where there any times when you were worried that you would run out of food because of a lack of money or other resources: Yes.   Transportation Barriers: During the last 12 months, have you missed a doctor's appointment because of transportation problems: No.   Childcare Barriers: If you are currently using childcare services, please identify  the type of services you use. (If not using childcare services, please select Not applicable): not applicable. During the last 12 months, have you had any barriers to childcare services such as: not applicable.   Housing: What is your housing situation today: I have housing.   Intimate Partner Violence: During the last 12 months, how often did your partner physically hurt you: never. During the last 12 months, how often did your partner insult you or talk down to you: never.  Medication Adherence: During the last 12 months, did you ever forget to take your medicine: Yes. During the last 12 months, were you careless ar times about taking your medicine: No. During the last 12 months, when you felt better did you sometimes stop taking  your medication: No. During the last 12 months, sometimes if you felt worse when you took your medicine did you stop taking it: No.   Risk reduction and counseling:   Health Coaching: Spoke with patient about the daily recommendations for fruits and vegetables. Showed patient what a serving size looks like. Patient does not consume fish often. Gave examples of heart healthy fish that she  can try incorporating into regular diet (tuna, salmon, mackerel, sardines, sea bass or trout). Patient consumes whole wheat bread and cheerios regularly. Gave suggestions for other whole grains that she can try such as (oatmeal, brown rice, whole wheat pasta). Patient has not been watching salt intake. Spoke with patient how too much sodium intake can raise BP. Encouraged patient to start watching salt intake while cooking and after food is prepared. Patient has been walking 2 days a week for 20-30 minutes. Explained that regular physical activity can also help with keeping blood pressure down.   Goal: Patient will increase fruit consumption to 2 servings daily and will increase vegetable consumption to 2 servings daily. Patient will also start watching salt intake. Patient will watch how much she adds while preparing food and not add more before eating. Patient will work on reaching both goals over the next month.   Navigation:  I will notify patient of lab results.  Patient is aware of 2 more health coaching sessions and a follow up.

## 2024-02-03 ENCOUNTER — Inpatient Hospital Stay: Attending: Obstetrics and Gynecology | Admitting: *Deleted

## 2024-02-03 ENCOUNTER — Other Ambulatory Visit: Payer: Self-pay

## 2024-02-03 VITALS — BP 135/81 | Ht 60.0 in | Wt 164.9 lb

## 2024-02-03 DIAGNOSIS — Z0000 Encounter for general adult medical examination without abnormal findings: Secondary | ICD-10-CM

## 2024-02-04 LAB — LIPID PANEL
Chol/HDL Ratio: 4.2 ratio (ref 0.0–4.4)
Cholesterol, Total: 172 mg/dL (ref 100–199)
HDL: 41 mg/dL (ref >39)
LDL Chol Calc (NIH): 111 mg/dL — ABNORMAL HIGH (ref 0–99)
Triglycerides: 111 mg/dL (ref 0–149)
VLDL Cholesterol Cal: 20 mg/dL (ref 5–40)

## 2024-02-04 LAB — HEMOGLOBIN A1C
Est. average glucose Bld gHb Est-mCnc: 137 mg/dL
Hgb A1c MFr Bld: 6.4 % — ABNORMAL HIGH (ref 4.8–5.6)

## 2024-02-04 LAB — GLUCOSE, RANDOM: Glucose: 107 mg/dL — ABNORMAL HIGH (ref 70–99)

## 2024-02-10 ENCOUNTER — Ambulatory Visit: Payer: Self-pay

## 2024-02-10 NOTE — Telephone Encounter (Signed)
Left message for patient about lab results from [facility]. Left name and number for patient to call back.   

## 2024-02-12 NOTE — Telephone Encounter (Signed)
 Health coaching 2   interpreter- [employer] [phone]   Labs- 172 cholesterol, 111 LDL cholesterol, 111 triglycerides, 41 HDL cholesterol, 6.4 hemoglobin A1C, 107 mean plasma glucose. Patient understands and is aware of her lab results.   Goals-  1. Continue watching the amount of fried and fatty foods consumed. Encouraged patient to continue making positive changes with diet since we have seen great improvement with her cholesterol levels from last year. 2. Reduce the amount of sweet and sugary foods and drinks consumed. 3. Watch the amount of carb rich foods consumed (breads, tortillas, rice, potatoes and pasta). 4. Encouraged patient to try and add in a few more days of exercising each week.   Navigation:  Patient is aware of 1 more health coaching sessions and a follow up. Patient is scheduled for FU with Internal Medicine on February 24, 2024 @ 9:15 am

## 2024-02-24 ENCOUNTER — Encounter: Payer: Self-pay | Admitting: Student

## 2024-02-24 ENCOUNTER — Ambulatory Visit: Payer: Self-pay | Admitting: Student

## 2024-02-24 VITALS — BP 118/80 | HR 62 | Temp 98.4°F | Ht 60.0 in | Wt 164.2 lb

## 2024-02-24 DIAGNOSIS — Z6832 Body mass index (BMI) 32.0-32.9, adult: Secondary | ICD-10-CM

## 2024-02-24 DIAGNOSIS — R748 Abnormal levels of other serum enzymes: Secondary | ICD-10-CM

## 2024-02-24 DIAGNOSIS — R7303 Prediabetes: Secondary | ICD-10-CM

## 2024-02-24 DIAGNOSIS — E66811 Obesity, class 1: Secondary | ICD-10-CM

## 2024-02-24 DIAGNOSIS — E782 Mixed hyperlipidemia: Secondary | ICD-10-CM

## 2024-02-24 DIAGNOSIS — E669 Obesity, unspecified: Secondary | ICD-10-CM

## 2024-02-24 DIAGNOSIS — F5101 Primary insomnia: Secondary | ICD-10-CM

## 2024-02-24 DIAGNOSIS — G4700 Insomnia, unspecified: Secondary | ICD-10-CM

## 2024-02-24 DIAGNOSIS — E785 Hyperlipidemia, unspecified: Secondary | ICD-10-CM

## 2024-02-24 NOTE — Assessment & Plan Note (Signed)
 Mild decrease.  But as above, patient is continue to work on lifestyle modifications.  Would benefit from continued monitoring here in office for adherence.

## 2024-02-24 NOTE — Assessment & Plan Note (Signed)
 Does not seem to be related to alcohol or any other substance use.  Poor sleep hygiene identified during this conversation.  - Patient is to have a sleep diary - Reviewed sleep hygiene changes - Offered over-the-counter melatonin or magnesium supplements - Return in about 4 to 6 weeks to discuss insomnia

## 2024-02-24 NOTE — Assessment & Plan Note (Signed)
 Remains elevated, however, a major improvement since last year. Lipid Panel     Component Value Date/Time   CHOL 172 02/03/2024 1038   TRIG 111 02/03/2024 1038   TRIG 307 (H) 10/31/2016 1229   HDL 41 02/03/2024 1038   CHOLHDL 4.2 02/03/2024 1038   CHOLHDL 5.4 10/21/2014 0853   VLDL 47 (H) 10/21/2014 0853   LDLCALC 111 (H) 02/03/2024 1038   LABVLDL 20 02/03/2024 1038   - Continue Lipitor 40 mg daily - Dietary changes specific to the Latinx diet - Continue lifestyle modifications  -Check lipid panel in 6 months

## 2024-02-24 NOTE — Assessment & Plan Note (Signed)
 Patient is unable to get orange card at this time.  Would like to hold off on repeating a CMP at this time.  We recommend CMP when able.

## 2024-02-24 NOTE — Progress Notes (Signed)
 Subjective:  CC: cholesterol and diabetes follow up  HPI:  [name] is a 48 y.o. female with a past medical history stated below and presents todayto discuss her most recent labs at [facility], available through [place]. Please see problem based assessment and plan for additional details.  Patient has been dealing with left symptoms exercising 3 times.,  For a few minutes of cardio per session.  Has been trying to reduce the number of calories that she eats in a day, and continues to take her Lipitor daily.  Her main concern today is that she has been having difficulty falling and staying asleep for the past 6 months to a year.  In discussing her sleep hygiene, she tends she often drinks caffeinated tea in the, including sodas, watches TV and stays on her phone until Maintenance,  eats well before going to sleep, and takes naps during the day.  Denies depressive symptoms, menopausal symptoms, restless leg, new stressors.  Has not taken any over-the-counter sleeping aids or herbal supplements.    Past Medical History:  Diagnosis Date   Gestational diabetes     Current Outpatient Medications on File Prior to Visit  Medication Sig Dispense Refill   atorvastatin  (LIPITOR) 40 MG tablet Take 1 tablet (40 mg total) by mouth daily. 30 tablet 11   No current facility-administered medications on file prior to visit.    Family History  Problem Relation Age of Onset   Diabetes Father    Hypertension Sister    Diabetes Sister    Breast cancer Neg Hx     Social History   Socioeconomic History   Marital status: Single    Spouse name: Not on file   Number of children: 3   Years of education: Not on file   Highest education level: Bachelor's degree (e.g., BA, AB, BS)  Occupational History   Not on file  Tobacco Use   Smoking status: Never   Smokeless tobacco: Never  Vaping Use   Vaping status: Never Used  Substance and Sexual Activity   Alcohol use: No   Drug use:  No   Sexual activity: Yes    Birth control/protection: Condom, Post-menopausal  Other Topics Concern   Not on file  Social History Narrative   Marital status: single; living with boyfriend; from [place]; moved to [place].       Children:  3 children; no grandchildren.       Lives: with boyfriend, 3 children.   Employment: works at [employer]; makes sandwiches and [employer]   Social Drivers of Corporate investment banker Strain: Not on file  Food Insecurity: No Food Insecurity (02/24/2024)   Hunger Vital Sign    Worried About Running Out of Food in the Last Year: Never true    Ran Out of Food in the Last Year: Never true  Transportation Needs: No Transportation Needs (02/24/2024)   PRAPARE - Administrator, Civil Service (Medical): No    Lack of Transportation (Non-Medical): No  Physical Activity: Not on file  Stress: Not on file  Social Connections: Not on file  Intimate Partner Violence: Not At Risk (02/24/2024)   Humiliation, Afraid, Rape, and Kick questionnaire    Fear of Current or Ex-Partner: No    Emotionally Abused: No    Physically Abused: No    Sexually Abused: No    Review of Systems: ROS negative except for what is noted on the assessment and plan.  Objective:  Vitals:   02/24/24 0911  BP: 118/80  Pulse: 62  Temp: 98.4 F (36.9 C)  TempSrc: Oral  SpO2: 99%  Weight: 164 lb 3.2 oz (74.5 kg)  Height: 5' (1.524 m)    Physical Exam: Constitutional: well-appearing woman sitting in chair, in no acute distress HENT: normocephalic atraumatic, no conjunctival or sublingual pallor ,mucous membranes moist Neck: supple Cardiovascular: regular rate and rhythm, no m/r/g Pulmonary/Chest: normal work of breathing on room air, lungs clear to auscultation bilaterally Abdominal: soft, non-tender, non-distended MSK: normal bulk and tone Neurological: alert & oriented x 3, 5/5 strength in bilateral upper and lower extremities, normal gait Skin: warm and dry,  strong along the WITHOUT splitting Psych: Mood and affect     02/24/2024    9:14 AM  Depression screen PHQ 2/9  Decreased Interest 0  Down, Depressed, Hopeless 0  PHQ - 2 Score 0        No data to display           Assessment & Plan:   Hyperlipidemia Remains elevated, however, a major improvement since last year. Lipid Panel     Component Value Date/Time   CHOL 172 02/03/2024 1038   TRIG 111 02/03/2024 1038   TRIG 307 (H) 10/31/2016 1229   HDL 41 02/03/2024 1038   CHOLHDL 4.2 02/03/2024 1038   CHOLHDL 5.4 10/21/2014 0853   VLDL 47 (H) 10/21/2014 0853   LDLCALC 111 (H) 02/03/2024 1038   LABVLDL 20 02/03/2024 1038   - Continue Lipitor 40 mg daily - Dietary changes specific to the Latinx diet - Continue lifestyle modifications  -Check lipid panel in 6 months  Prediabetes Continues to be in the prediabetic range with good fasting plasma glucose less than 126.  - Discussed diabetic's disease process, prevention, and need for heightened diet and exercise management - Continue lifestyle modifications  Insomnia Does not seem to be related to alcohol or any other substance use.  Poor sleep hygiene identified during this conversation.  - Patient is to have a sleep diary - Reviewed sleep hygiene changes - Offered over-the-counter melatonin or magnesium supplements - Return in about 4 to 6 weeks to discuss insomnia  Obesity (BMI 30.0-34.9) Mild decrease.  But as above, patient is continue to work on lifestyle modifications.  Would benefit from continued monitoring here in office for adherence.  Elevated alkaline phosphatase level Patient is unable to get orange card at this time.  Would like to hold off on repeating a CMP at this time.  We recommend CMP when able.    Return in about 6 weeks (around 04/06/2024) for Insomnia check and lifestyle modifications when I am in clinic. Ok to [employer] as well.  Patient discussed with Dr. Lovie Hadassah Kristy Rosario, MD [facility] [place] [facility]  02/24/2024, 10:30 AM

## 2024-02-24 NOTE — Patient Instructions (Addendum)
   Me encanto verla en mi clinica hoy. Felicitaciones por los cambios que ha hecho en su dia a dia.   Estas son mis recomendaciones hoy:  Insomnia Trate:  Magnesio or Melatonina (empieza con 3 mg, y aumenta si no te funciona)  Higiene del dormir Establezca un horario.  Para descansar mejor, acostmbrese a acostarse y levantarse a la misma hora todos los [place], incluso los fines de [place]. Es tentador dormir Teacher, adult education tarde, pero eso puede alterar su horario del sueo. Evite la cafena durante la tarde. La cafena e incluso el caf descafeinado pueden permanecer en el cuerpo hasta por 10 horas y afectar  negativamente su capacidad de conciliar el sueo y permanecer dormido. Evite las siestas.  Dormir una siesta durante el da puede dificultar el sueo por la noche. Las siestas de ms de una hora o  cerca del final del da son especialmente perjudiciales para la higiene del sueo. Utilice la cama slo para dormir. Es ms probable que usted se sienta cansado al acostarse si su cuerpo relaciona la cama con actividades  relajantes. Utilizar el telfono, comer o trabajar en la cama puede tener el efecto contrario, haciendo que su  cuerpo asocie la cama con la vigilia o incluso el estrs. Haga que su dormitorio sea cmodo. Asegrese de que su habitacin sea lo suficientemente silenciosa, fresca y obscura. Utilice cualquier cosa  que necesite para conseguirlo (antifaz, ventilador, ruido blanco, cortinas gruesas, etc.). Apague las pantallas.  La luz azul que [employer] telfonos, las computadoras, los televisores y otras pantallas puede alterar el  ritmo natural del sueo y Radio producer que se sienta ms despierto. Realice actividades sin pantalla una hora antes  de acostarse. No se fuerce a dormir.  Si usted no logra dormirse en un lapso de 20 minutos, levntese de la cama y haga algo que le relaje, Armed forces training and education officer, escribir en un diario o dibujar. Pero recuerde evitar las pantallas y cualquier actividad  estimulante. Coma frutas y verduras.  Las investigaciones demuestran que una dieta equilibrada y variada con frutas, verduras, cereales  integrales y protenas bajas en grasa puede mejorar el sueo\\\   Comidas bajas en colesterol para la dieta latina: vea el folleto de abajo!  Regrese a Horticulturist, commercial en ~6 semanas para vernos nuevamente y Radio producer seguimiento a sus Special educational needs teacher.  Hadassah Ala, MD

## 2024-02-24 NOTE — Assessment & Plan Note (Addendum)
 Continues to be in the prediabetic range with good fasting plasma glucose less than 126.  - Discussed diabetic's disease process, prevention, and need for heightened diet and exercise management - Continue lifestyle modifications

## 2024-02-25 NOTE — Progress Notes (Signed)
 Internal Medicine Clinic Attending  Case discussed with the resident at the time of the visit.  We reviewed the resident's history and exam and pertinent patient test results.  I agree with the assessment, diagnosis, and plan of care documented in the resident's note.

## 2024-04-06 ENCOUNTER — Encounter: Admitting: Student

## 2024-04-18 NOTE — Progress Notes (Deleted)
 CC: ***  HPI: [name] is a 48 y.o. female living with a history stated below and presents today for ***. Please see problem based assessment and plan for additional details.  Past Medical History:  Diagnosis Date   Gestational diabetes     Current Outpatient Medications on File Prior to Visit  Medication Sig Dispense Refill   atorvastatin  (LIPITOR) 40 MG tablet Take 1 tablet (40 mg total) by mouth daily. 30 tablet 11   No current facility-administered medications on file prior to visit.    Family History  Problem Relation Age of Onset   Diabetes Father    Hypertension Sister    Diabetes Sister    Breast cancer Neg Hx     Social History   Socioeconomic History   Marital status: Single    Spouse name: Not on file   Number of children: 3   Years of education: Not on file   Highest education level: Bachelor's degree (e.g., BA, AB, BS)  Occupational History   Not on file  Tobacco Use   Smoking status: Never   Smokeless tobacco: Never  Vaping Use   Vaping status: Never Used  Substance and Sexual Activity   Alcohol use: No   Drug use: No   Sexual activity: Yes    Birth control/protection: Condom, Post-menopausal  Other Topics Concern   Not on file  Social History Narrative   Marital status: single; living with boyfriend; from [place]; moved to [place]  2001.       Children:  3 children; no grandchildren.       Lives: with boyfriend, 3 children.   Employment: works at [employer]; makes sandwiches and [employer]   Social Drivers of Corporate investment banker Strain: Not on file  Food Insecurity: No Food Insecurity (02/24/2024)   Hunger Vital Sign    Worried About Running Out of Food in the Last Year: Never true    Ran Out of Food in the Last Year: Never true  Transportation Needs: No Transportation Needs (02/24/2024)   PRAPARE - Administrator, Civil Service (Medical): No    Lack of Transportation (Non-Medical): No  Physical Activity: Not on  file  Stress: Not on file  Social Connections: Not on file  Intimate Partner Violence: Not At Risk (02/24/2024)   Humiliation, Afraid, Rape, and Kick questionnaire    Fear of Current or Ex-Partner: No    Emotionally Abused: No    Physically Abused: No    Sexually Abused: No    Review of Systems: ROS negative except for what is noted on the assessment and plan.  There were no vitals filed for this visit.  Physical Exam  Physical Exam: Constitutional: well-appearing *** sitting in ***, in no acute distress HENT: normocephalic atraumatic, mucous membranes moist Eyes: conjunctiva non-erythematous Neck: supple Cardiovascular: regular rate and rhythm, no m/r/g Pulmonary/Chest: normal work of breathing on room air, lungs clear to auscultation bilaterally Abdominal: soft, non-tender, non-distended MSK: *** Neurological: alert & oriented x 3, 5/5 strength in bilateral upper and lower extremities, normal gait Skin: warm and dry Psych: ***  Assessment & Plan:   Assessment & Plan   No orders of the defined types were placed in this encounter.  PMH: Prediabetes, insomnia, HLD  Insomnia ***Offered OTC melatonin or magnesium  HLD Prediabetes  Uninsured*** Care: Colonoscopy, HCV, Tdap, flu  No follow-ups on file.   Patient {[id number]::discussed with,seen with} Dr. {[id number]::Tpoopjfd,Z. Hoffman,Winfrey,Narendra,Chun,Chambliss,Lau,Machen}  Ozell Nearing, D.O. [facility] Internal  Medicine, PGY-3 Phone: [phone] Date 04/18/2024 Time 10:46 PM

## 2024-04-20 ENCOUNTER — Ambulatory Visit: Admitting: Student

## 2024-05-07 ENCOUNTER — Other Ambulatory Visit: Payer: Self-pay | Admitting: Student

## 2024-05-07 ENCOUNTER — Other Ambulatory Visit: Payer: Self-pay

## 2024-05-07 DIAGNOSIS — E782 Mixed hyperlipidemia: Secondary | ICD-10-CM

## 2024-05-08 ENCOUNTER — Other Ambulatory Visit: Payer: Self-pay

## 2024-05-08 MED ORDER — ATORVASTATIN CALCIUM 40 MG PO TABS
40.0000 mg | ORAL_TABLET | Freq: Every day | ORAL | 11 refills | Status: AC
  Filled 2024-05-08: qty 30, 30d supply, fill #0

## 2024-05-08 NOTE — Telephone Encounter (Signed)
 Medication sent to pharmacy

## 2024-05-19 ENCOUNTER — Other Ambulatory Visit: Payer: Self-pay
